# Patient Record
Sex: Female | Born: 1970 | Race: White | Hispanic: No | Marital: Married | Smoking: Never smoker
Health system: Southern US, Community
[De-identification: ages and names within clinical notes are randomized; demographics above are authoritative.]

## PROBLEM LIST (undated history)

## (undated) DIAGNOSIS — K219 Gastro-esophageal reflux disease without esophagitis: Secondary | ICD-10-CM

## (undated) DIAGNOSIS — T7840XA Allergy, unspecified, initial encounter: Secondary | ICD-10-CM

## (undated) DIAGNOSIS — K648 Other hemorrhoids: Secondary | ICD-10-CM

## (undated) DIAGNOSIS — Z5189 Encounter for other specified aftercare: Secondary | ICD-10-CM

## (undated) HISTORY — PX: HEMORRHOID SURGERY: SHX153

## (undated) HISTORY — DX: Allergy, unspecified, initial encounter: T78.40XA

## (undated) HISTORY — DX: Encounter for other specified aftercare: Z51.89

## (undated) HISTORY — DX: Other hemorrhoids: K64.8

## (undated) HISTORY — DX: Gastro-esophageal reflux disease without esophagitis: K21.9

---

## 1999-04-23 ENCOUNTER — Emergency Department (HOSPITAL_COMMUNITY): Admission: EM | Admit: 1999-04-23 | Discharge: 1999-04-23 | Payer: Self-pay | Admitting: Emergency Medicine

## 1999-04-23 ENCOUNTER — Encounter: Payer: Self-pay | Admitting: Emergency Medicine

## 1999-09-21 ENCOUNTER — Encounter: Admission: RE | Admit: 1999-09-21 | Discharge: 1999-12-20 | Payer: Self-pay | Admitting: Family Medicine

## 2000-02-02 ENCOUNTER — Encounter: Admission: RE | Admit: 2000-02-02 | Discharge: 2000-02-02 | Payer: Self-pay | Admitting: Hematology and Oncology

## 2003-01-08 ENCOUNTER — Encounter: Payer: Self-pay | Admitting: Family Medicine

## 2003-01-08 ENCOUNTER — Ambulatory Visit (HOSPITAL_COMMUNITY): Admission: RE | Admit: 2003-01-08 | Discharge: 2003-01-08 | Payer: Self-pay | Admitting: Family Medicine

## 2003-01-21 ENCOUNTER — Encounter: Admission: RE | Admit: 2003-01-21 | Discharge: 2003-01-21 | Payer: Self-pay | Admitting: Family Medicine

## 2003-01-21 ENCOUNTER — Encounter: Payer: Self-pay | Admitting: Family Medicine

## 2003-04-05 ENCOUNTER — Emergency Department (HOSPITAL_COMMUNITY): Admission: AD | Admit: 2003-04-05 | Discharge: 2003-04-05 | Payer: Self-pay | Admitting: Family Medicine

## 2004-03-21 ENCOUNTER — Emergency Department (HOSPITAL_COMMUNITY): Admission: EM | Admit: 2004-03-21 | Discharge: 2004-03-21 | Payer: Self-pay | Admitting: Emergency Medicine

## 2005-07-26 ENCOUNTER — Emergency Department (HOSPITAL_COMMUNITY): Admission: EM | Admit: 2005-07-26 | Discharge: 2005-07-26 | Payer: Self-pay | Admitting: Family Medicine

## 2006-04-13 ENCOUNTER — Emergency Department (HOSPITAL_COMMUNITY): Admission: EM | Admit: 2006-04-13 | Discharge: 2006-04-13 | Payer: Self-pay | Admitting: Family Medicine

## 2007-07-28 ENCOUNTER — Emergency Department (HOSPITAL_COMMUNITY): Admission: EM | Admit: 2007-07-28 | Discharge: 2007-07-28 | Payer: Self-pay | Admitting: Family Medicine

## 2008-01-15 ENCOUNTER — Ambulatory Visit: Payer: Self-pay | Admitting: Occupational Medicine

## 2008-01-15 DIAGNOSIS — J069 Acute upper respiratory infection, unspecified: Secondary | ICD-10-CM | POA: Insufficient documentation

## 2008-01-15 DIAGNOSIS — R519 Headache, unspecified: Secondary | ICD-10-CM | POA: Insufficient documentation

## 2008-01-15 DIAGNOSIS — R51 Headache: Secondary | ICD-10-CM | POA: Insufficient documentation

## 2008-01-18 ENCOUNTER — Ambulatory Visit: Payer: Self-pay | Admitting: Occupational Medicine

## 2008-10-10 ENCOUNTER — Ambulatory Visit: Payer: Self-pay | Admitting: Internal Medicine

## 2008-10-10 DIAGNOSIS — K219 Gastro-esophageal reflux disease without esophagitis: Secondary | ICD-10-CM | POA: Insufficient documentation

## 2008-10-11 ENCOUNTER — Encounter (INDEPENDENT_AMBULATORY_CARE_PROVIDER_SITE_OTHER): Payer: Self-pay | Admitting: *Deleted

## 2008-10-11 LAB — CONVERTED CEMR LAB
ALT: 28 units/L (ref 0–35)
AST: 24 units/L (ref 0–37)
Albumin: 3.8 g/dL (ref 3.5–5.2)
Alkaline Phosphatase: 46 units/L (ref 39–117)
BUN: 8 mg/dL (ref 6–23)
Basophils Absolute: 0 10*3/uL (ref 0.0–0.1)
Basophils Relative: 0 % (ref 0.0–3.0)
Bilirubin Urine: NEGATIVE
Bilirubin, Direct: 0.1 mg/dL (ref 0.0–0.3)
CO2: 26 meq/L (ref 19–32)
Calcium: 8.9 mg/dL (ref 8.4–10.5)
Chloride: 109 meq/L (ref 96–112)
Cholesterol: 150 mg/dL (ref 0–200)
Creatinine, Ser: 0.8 mg/dL (ref 0.4–1.2)
Eosinophils Absolute: 0.1 10*3/uL (ref 0.0–0.7)
Eosinophils Relative: 1.9 % (ref 0.0–5.0)
GFR calc non Af Amer: 84.93 mL/min (ref >60)
Glucose, Bld: 90 mg/dL (ref 70–99)
HCT: 35.5 % — ABNORMAL LOW (ref 36.0–46.0)
HDL: 51.4 mg/dL (ref >39.00)
Hemoglobin, Urine: NEGATIVE
Hemoglobin: 11.8 g/dL — ABNORMAL LOW (ref 12.0–15.0)
Ketones, ur: NEGATIVE mg/dL
LDL Cholesterol: 86 mg/dL (ref 0–99)
Leukocytes, UA: NEGATIVE
Lymphocytes Relative: 36.8 % (ref 12.0–46.0)
Lymphs Abs: 1.7 10*3/uL (ref 0.7–4.0)
MCHC: 33.3 g/dL (ref 30.0–36.0)
MCV: 86 fL (ref 78.0–100.0)
Monocytes Absolute: 0.5 10*3/uL (ref 0.1–1.0)
Monocytes Relative: 11.1 % (ref 3.0–12.0)
Neutro Abs: 2.2 10*3/uL (ref 1.4–7.7)
Neutrophils Relative %: 50.2 % (ref 43.0–77.0)
Nitrite: NEGATIVE
Platelets: 395 10*3/uL (ref 150.0–400.0)
Potassium: 3.9 meq/L (ref 3.5–5.1)
RBC: 4.13 M/uL (ref 3.87–5.11)
RDW: 14.6 % (ref 11.5–14.6)
Sodium: 139 meq/L (ref 135–145)
Specific Gravity, Urine: <=1.005 (ref 1.000–1.030)
TSH: 1.44 microintl units/mL (ref 0.35–5.50)
Total Bilirubin: 0.6 mg/dL (ref 0.3–1.2)
Total CHOL/HDL Ratio: 3
Total Protein, Urine: NEGATIVE mg/dL
Total Protein: 7.2 g/dL (ref 6.0–8.3)
Triglycerides: 64 mg/dL (ref 0.0–149.0)
Urine Glucose: NEGATIVE mg/dL
Urobilinogen, UA: 0.2 (ref 0.0–1.0)
VLDL: 12.8 mg/dL (ref 0.0–40.0)
WBC: 4.5 10*3/uL (ref 4.5–10.5)
pH: 6 (ref 5.0–8.0)

## 2008-10-14 ENCOUNTER — Encounter (INDEPENDENT_AMBULATORY_CARE_PROVIDER_SITE_OTHER): Payer: Self-pay | Admitting: *Deleted

## 2008-10-14 LAB — CONVERTED CEMR LAB: Vit D, 25-Hydroxy: 23 ng/mL — ABNORMAL LOW (ref 30–89)

## 2008-11-25 ENCOUNTER — Telehealth: Payer: Self-pay | Admitting: Internal Medicine

## 2009-06-30 ENCOUNTER — Ambulatory Visit: Payer: Self-pay | Admitting: Internal Medicine

## 2009-06-30 DIAGNOSIS — K645 Perianal venous thrombosis: Secondary | ICD-10-CM | POA: Insufficient documentation

## 2009-10-14 ENCOUNTER — Ambulatory Visit (HOSPITAL_COMMUNITY): Admission: RE | Admit: 2009-10-14 | Discharge: 2009-10-14 | Payer: Self-pay | Admitting: General Surgery

## 2009-10-15 ENCOUNTER — Emergency Department (HOSPITAL_BASED_OUTPATIENT_CLINIC_OR_DEPARTMENT_OTHER): Admission: EM | Admit: 2009-10-15 | Discharge: 2009-10-15 | Payer: Self-pay | Admitting: Emergency Medicine

## 2009-11-04 ENCOUNTER — Ambulatory Visit: Payer: Self-pay | Admitting: Family Medicine

## 2009-11-20 ENCOUNTER — Ambulatory Visit: Payer: Self-pay | Admitting: Sports Medicine

## 2009-11-20 DIAGNOSIS — M7750 Other enthesopathy of unspecified foot: Secondary | ICD-10-CM | POA: Insufficient documentation

## 2010-02-04 ENCOUNTER — Ambulatory Visit: Payer: Self-pay | Admitting: Emergency Medicine

## 2010-02-04 ENCOUNTER — Telehealth (INDEPENDENT_AMBULATORY_CARE_PROVIDER_SITE_OTHER): Payer: Self-pay | Admitting: *Deleted

## 2010-02-04 DIAGNOSIS — J3489 Other specified disorders of nose and nasal sinuses: Secondary | ICD-10-CM | POA: Insufficient documentation

## 2010-04-28 NOTE — Assessment & Plan Note (Signed)
Summary: R foot pain x 2 mths rm 4   Vital Signs:  Patient Profile:   40 Years Old Female CC:      R Foot pain x 2 mths Height:     68 inches (172.72 cm) Weight:      186.8 pounds O2 Sat:      100 % O2 treatment:    Room Air Temp:     98.3 degrees F oral Pulse rate:   75 / minute Pulse rhythm:   regular Resp:     16 per minute BP sitting:   123 / 82  (right arm) Cuff size:   regular  Vitals Entered By: Areta Haber CMA (November 04, 2009 4:52 PM)                  Current Allergies: ! PENICILLIN     History of Present Illness Chief Complaint: R Foot pain x 2 mths History of Present Illness: 40 yo F here with right plantar foot pain x 2 months.  Patient reports no known injury.  Pain started slowly and worsened over that time.  No swelling or bruising.  Better with rest.  Worse with ambulation especially in heels.  No problems with left foot.  Does not run for exercise.  No prior h/o stress fracture.  No prior h/o osteoporosis - vitamin D was low at a visit 1 year ago but was not repleted.  Current Problems: FOOT PAIN, RIGHT (ICD-729.5) UNSPECIFIED THROMBOSED HEMORRHOIDS (ICD-455.7) SPECIAL SCREENING FOR OSTEOPOROSIS (ICD-V82.81) PHYSICAL EXAMINATION (ICD-V70.0) GERD (ICD-530.81) JAUNDICE, HX OF (ICD-V15.9) EATING DISORDER, HX OF (ICD-V15.89) CHICKENPOX, HX OF (ICD-V15.9) URI (ICD-465.9) HEADACHE (ICD-784.0) DEPRESSION (ICD-311)   Current Meds ATIVAN 0.5 MG TABS (LORAZEPAM) prn GIANVI 3-0.02 MG TABS (DROSPIRENONE-ETHINYL ESTRADIOL) once daily ANALPRAM-HC SINGLES 1-2.5 % CREA (HYDROCORTISONE ACE-PRAMOXINE) apply three times a day as needed  REVIEW OF SYSTEMS Constitutional Symptoms      Denies fever, chills, night sweats, weight loss, weight gain, and fatigue.  Eyes       Denies change in vision, eye pain, eye discharge, glasses, contact lenses, and eye surgery. Ear/Nose/Throat/Mouth       Denies hearing loss/aids, change in hearing, ear pain, ear discharge,  dizziness, frequent runny nose, frequent nose bleeds, sinus problems, sore throat, hoarseness, and tooth pain or bleeding.  Respiratory       Denies dry cough, productive cough, wheezing, shortness of breath, asthma, bronchitis, and emphysema/COPD.  Cardiovascular       Denies murmurs, chest pain, and tires easily with exhertion.    Gastrointestinal       Denies stomach pain, nausea/vomiting, diarrhea, constipation, blood in bowel movements, and indigestion. Genitourniary       Denies painful urination, kidney stones, and loss of urinary control. Neurological       Denies paralysis, seizures, and fainting/blackouts. Musculoskeletal       Denies muscle pain, joint pain, joint stiffness, decreased range of motion, redness, swelling, muscle weakness, and gout.  Skin       Denies bruising, unusual mles/lumps or sores, and hair/skin or nail changes.  Psych       Denies mood changes, temper/anger issues, anxiety/stress, speech problems, depression, and sleep problems. Other Comments: R foot pain x 2 mths.    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  Mild pes planus.  TTP 2nd MT head > 3rd MT head plantar aspect of foot.  Minimal callus formation.  No hallux rigidus or valgus. Assessment New Problems: FOOT PAIN, RIGHT (ICD-729.5)  2/2 metatarsalgia and less likely mortons neuroma  (none palpated) or stress fracture (no running but vit d low previously)  Plan New Orders: New Patient Level II [99202] Planning Comments:   Will make appointment at [facility] for ultrasound to assess for stress fracture and metatarsal pads if none seen.  Told to bring favorite 2-3 pairs of heels normally wears for work.  Will establish care with Dr. Linford Arnold or Bowen and have Vit D rechecked before repletion since has been over a year.  Meanwhile take OTC supplements >1000 International Units/day.  Follow Up: Follow up on an as needed basis  The patient and/or caregiver has been counseled thoroughly with regard to medications prescribed including dosage, schedule, interactions, rationale for use, and possible side effects and they verbalize understanding.  Diagnoses and expected course of recovery discussed and will return if not improved as expected or if the condition worsens. Patient and/or caregiver verbalized understanding.   Orders Added: 1)  New Patient Level II [99202]

## 2010-04-28 NOTE — Assessment & Plan Note (Signed)
Summary: HEMORRHOIDS/NWS   Vital Signs:  Patient profile:   40 year old female Height:      68 inches (172.72 cm) Weight:      185.4 pounds (84.27 kg) O2 Sat:      98 % on Room air Temp:     98.8 degrees F (37.11 degrees C) oral Pulse rate:   81 / minute BP sitting:   120 / 72  (left arm) Cuff size:   regular  Vitals Entered By: Orlan Leavens (June 30, 2009 1:52 PM)  O2 Flow:  Room air CC: Hemorrhoids Is Patient Diabetic? No   Primary Care Provider:  Newt Lukes MD  CC:  Hemorrhoids.  History of Present Illness: here with c/o hemorrhoid flare - hx same but not this bad in years - tx self with sitz baths -and cortisone or prep H - currently, pain is 9/10 and unable to sit due to pain - (sit on pillow) no fever, min bleeding except with BM - no fever or drainage or leaking -   Current Medications (verified): 1)  Ativan 0.5 Mg Tabs (Lorazepam) .... Prn 2)  Diclofenac Potassium 50 Mg Tabs (Diclofenac Potassium) .Marland Kitchen.. 1 By Mouth Two Times A Day As Needed Musculokeletal Pain 3)  Doryx 150 Mg Tbec (Doxycycline Hyclate) .... Take 1 By Mouth Qd 4)  Gianvi 3-0.02 Mg Tabs (Drospirenone-Ethinyl Estradiol) .... Once Daily  Allergies (verified): 1)  ! Penicillin  Past History:  Past Medical History: Depression Headache GERD  Review of Systems  The patient denies fever, weight loss, chest pain, headaches, and abdominal pain.    Physical Exam  General:  alert, well-developed, well-nourished, and cooperative to examination.    Lungs:  normal respiratory effort, no intercostal retractions or use of accessory muscles; normal breath sounds bilaterally - no crackles and no wheezes.    Heart:  normal rate, regular rhythm, no murmur, and no rub. BLE without edema. normal DP pulses and normal cap refill in all 4 extremities    Rectal:  thrombosed and prolapsed hemorrhoid at 6 oclock as well as other smaller affected areas circumfrentially, ?condoloma - no fissure and no  appreciable abcess or infection on exam   Impression & Recommendations:  Problem # 1:  UNSPECIFIED THROMBOSED HEMORRHOIDS (ICD-455.7)  uncertain if internal or external with exam so will refer to gen surg for definitve tx of same - cont topical tx until then -  at pt request, will give medrol shot for antiinflammatory as has good relief with same on cruise ship previously -  Orders: Prescription Created Electronically [phone]) Surgical Referral (Surgery) Depo- Medrol 80mg  (J1040) Admin of Therapeutic Inj  intramuscular or subcutaneous (56433)  Complete Medication List: 1)  Ativan 0.5 Mg Tabs (Lorazepam) .... Prn 2)  Diclofenac Potassium 50 Mg Tabs (Diclofenac potassium) .Marland Kitchen.. 1 by mouth two times a day as needed musculokeletal pain 3)  Doryx 150 Mg Tbec (Doxycycline hyclate) .... Take 1 by mouth qd 4)  Gianvi 3-0.02 Mg Tabs (Drospirenone-ethinyl estradiol) .... Once daily 5)  Analpram-hc Singles 1-2.5 % Crea (Hydrocortisone ace-pramoxine) .... Apply three times a day as needed  Patient Instructions: 1)  it was good to see you today.  2)  shot of medrol (steroids) given for inflammation today - 3)  Analpram HC 2.5% as discussed - your prescription has been electronically submitted to your pharmacy. Please take as directed. Contact our office if you believe you're having problems with the medication(s).  4)  we'll make referral to GI  or general surgery for definitve treatment of the problem, not just the symptoms. Our office will contact you regarding this appointment once made.  5)  Please schedule a follow-up appointment as needed. Prescriptions: ANALPRAM-HC SINGLES 1-2.5 % CREA (HYDROCORTISONE ACE-PRAMOXINE) apply three times a day as needed  #30 x 1   Entered and Authorized by:   Newt Lukes MD   Signed by:   Newt Lukes MD on 06/30/2009   Method used:   Electronically to        [facility]* (retail)       [address]       [address]  Shipping/mailing       [place], [place]  [address]       Ph: [phone]       Fax: [phone]   RxID:   [id number]    Medication Administration  Injection # 1:    Medication: Depo- Medrol 80mg     Diagnosis: UNSPECIFIED THROMBOSED HEMORRHOIDS (ICD-455.7)    Route: IM    Site: LUOQ gluteus    Exp Date: 01/2012    Lot #: [id number]    Mfr: Pharmacia    Patient tolerated injection without complications    Given by: Orlan Leavens (June 30, 2009 3:09 PM)  Orders Added: 1)  Est. Patient Level IV [84132] 2)  Prescription Created Electronically [id number] 3)  Surgical Referral [Surgery] 4)  Depo- Medrol 80mg  [J1040] 5)  Admin of Therapeutic Inj  intramuscular or subcutaneous [27253]

## 2010-04-28 NOTE — Progress Notes (Signed)
  Phone Note Outgoing Call Call back at [facility]   Action Taken: Phone Call Completed Summary of Call: Rx for Z-pak called in to [facility] outpatient pharm for [place] Simmonds. Spoke with Morrie Sheldon

## 2010-04-28 NOTE — Assessment & Plan Note (Signed)
Summary: SWELLING AROUND NOSE/WB   Vital Signs:  Patient Profile:   40 Years Old Female CC:      Swelling around nose x 4 days Height:     68 inches (172.72 cm) Weight:      185 pounds O2 Sat:      98 % O2 treatment:    Room Air Temp:     98.6 degrees F oral Pulse rate:   83 / minute Pulse rhythm:   regular Resp:     16 per minute BP sitting:   114 / 77  (left arm) Cuff size:   regular  Vitals Entered By: Emilio Math (February 04, 2010 8:02 AM)                  Current Allergies (reviewed today): ! PENICILLINHistory of Present Illness History from: patient Chief Complaint: Swelling around nose x 4 days History of Present Illness: Swelling around her nose for 4 days.  Doesn't recall any trauma.  Feels tender, sore with sinus pressure.  Using Sudafed for allergies and using Ibuprofen which helps this.  No F/C/N/V, mild URI symptoms.    Current Meds ATIVAN 0.5 MG TABS (LORAZEPAM) prn GIANVI 3-0.02 MG TABS (DROSPIRENONE-ETHINYL ESTRADIOL) once daily PREVACID 15 MG CPDR (LANSOPRAZOLE)  DORYX 150 MG TBEC (DOXYCYCLINE HYCLATE)  ZITHROMAX Z-PAK 250 MG TABS (AZITHROMYCIN) use as directed  REVIEW OF SYSTEMS Constitutional Symptoms      Denies fever, chills, night sweats, weight loss, weight gain, and fatigue.  Eyes       Denies change in vision, eye pain, eye discharge, glasses, contact lenses, and eye surgery. Ear/Nose/Throat/Mouth       Denies hearing loss/aids, change in hearing, ear pain, ear discharge, dizziness, frequent runny nose, frequent nose bleeds, sinus problems, sore throat, hoarseness, and tooth pain or bleeding.  Respiratory       Denies dry cough, productive cough, wheezing, shortness of breath, asthma, bronchitis, and emphysema/COPD.  Cardiovascular       Denies murmurs, chest pain, and tires easily with exhertion.    Gastrointestinal       Denies stomach pain, nausea/vomiting, diarrhea, constipation, blood in bowel movements, and  indigestion. Genitourniary       Denies painful urination, kidney stones, and loss of urinary control. Neurological       Denies paralysis, seizures, and fainting/blackouts. Musculoskeletal       Complains of swelling.      Denies muscle pain, joint pain, joint stiffness, decreased range of motion, redness, muscle weakness, and gout.  Skin       Denies bruising, unusual mles/lumps or sores, and hair/skin or nail changes.  Psych       Denies mood changes, temper/anger issues, anxiety/stress, speech problems, depression, and sleep problems.  Past History:  Past Medical History: Reviewed history from 06/30/2009 and no changes required. Depression Headache GERD  Past Surgical History: Reviewed history from 10/10/2008 and no changes required. Tonsillectomy (1973)  Family History: Reviewed history from 10/10/2008 and no changes required. Heart disease (parent & grandparent) Emotional/Mental Illness (grandparent) pancreatic cancer - dad died 31 y  Social History: Reviewed history from 10/10/2008 and no changes required. 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  TTP on nasal bridge, perhaps mild swelling, no bruising Oral/Pharynx: mild clear PND Neck: neck supple,  trachea  midline, no masses Chest/Lungs: no rales, wheezes, or rhonchi bilateral, breath sounds equal without effort Heart: regular rate and  rhythm, no murmur Skin: no obvious rashes or lesions MSE: oriented to time, place, and person Assessment New Problems: OTHER DISEASES OF NASAL CAVITY AND SINUSES (ICD-478.19)  Possible unknown trauma and nasal contusion.  Otherwise perhaps sinus infection.  Patient Education: Patient and/or caregiver instructed in the following: rest, fluids,  Ibuprofen prn.  Plan New Medications/Changes: ZITHROMAX Z-PAK 250 MG TABS (AZITHROMYCIN) use as directed  #1 x 0, 02/04/2010, Hoyt Koch MD  New Orders: Est. Patient Level III [phone] Planning Comments:   Ice bridge of nose and continue your normal allergy meds for the next few days.  If not improving, then should take the Zpak to treat a sinus infection.  If worsening or new symptoms, consider PCP or ENT follow up.  If not improving in 3-5 days despite antibiotics, can call in "Flonase 2 sprays each nostril daily for 1 week, Disp #1" to see if that can reduce the inflammation.   The patient and/or caregiver has been counseled thoroughly with regard to medications prescribed including dosage, schedule, interactions, rationale for use, and possible side effects and they verbalize understanding.  Diagnoses and expected course of recovery discussed and will return if not improved as expected or if the condition worsens. Patient and/or caregiver verbalized understanding.  Prescriptions: ZITHROMAX Z-PAK 250 MG TABS (AZITHROMYCIN) use as directed  #1 x 0   Entered and Authorized by:   Hoyt Koch MD   Signed by:   Hoyt Koch MD on 02/04/2010   Method used:   Print then Give to Patient   RxID:   [id number]   Orders Added: 1)  Est. Patient Level III [95621]

## 2010-04-28 NOTE — Assessment & Plan Note (Signed)
Summary: NP R FOOT PAIN X 3 MOS   Vital Signs:  Patient profile:   40 year old female Height:      68 inches Weight:      180 pounds BMI:     27.47 BP sitting:   102 / 71  Vitals Entered By: Enid Baas MD (November 20, 2009 10:03 AM)  Primary Provider:  Newt Lukes MD   History of Present Illness: Tamara Lozano is a 40 year old female who presents today with a 3 month history of right foot pain. She localizes the pain to the plantar surface of the foot, at the metatarsal heads. 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 She does not experience significant numbness or tingling in the foot or toes. She also experiences the pain in her athletic shoes, but it is not as bad. She denies any weakness or loss of sensation.  Allergies: 1)  ! 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 Some callous on the medial aspect of the first MTP, bilaterally, as well. Transverse arch collapse bilaterally. Pronation while standing and walking, bilaterally (L>R). Mild tenderness to palpation along heads of the right second and third metatarsal heads. No tenderness at insertion of plantar fascia on calcaneous.  5/5 foot dorsiflexion, plantarflexion, eversion and inversion with full range of motion.  Neurologic:  Sensation intact to light touch on the feet and toes bilaterally.   Impression & Recommendations:  Problem # 1:  FOOT PAIN, RIGHT (ICD-729.5)  Tamara Lozano presents with a three month history of right foot pain localized to the forefoot. Her symptoms and clinical signs of mortons foot, callous, transverse arch collapse and pronation make pain from stress at the second and third metatarsal heads the  most likely etiology. We will try metatarsal pads in her heels and sports insoles with metatarsal pads in her running shoes to take pressure off the metatarsal heads.  Orders: Sports Insoles [phone])  Problem # 2:  METATARSALGIA (ICD-726.70)  The patients pain is consistent with metatarasalgia. In a patient with Morton's foot and significant transverse arch collapse, we will try to support that arch using metatarasal pads in her heels: small in the left shoe and medium in the right shoe. We will also add sports insoles with metatarsal pads in her athletic shoes to support the transverse and longitudinal arches. The patient tried these adjustments and did not experience any pain. She is to return in 2-3 weeks if they seem to help and we will put pads in her other shoes.  Orders: Sports Insoles [phone])  Complete Medication List: 1)  Ativan 0.5 Mg Tabs (Lorazepam) .... Prn 2)  Gianvi 3-0.02 Mg Tabs (Drospirenone-ethinyl estradiol) .... Once daily 3)  Analpram-hc Singles 1-2.5 % Crea (Hydrocortisone ace-pramoxine) .... Apply three times a day as needed

## 2010-06-13 LAB — URINALYSIS, ROUTINE W REFLEX MICROSCOPIC
Bilirubin Urine: NEGATIVE
Glucose, UA: NEGATIVE mg/dL
Hgb urine dipstick: NEGATIVE
Ketones, ur: NEGATIVE mg/dL
Nitrite: NEGATIVE
Protein, ur: NEGATIVE mg/dL
Specific Gravity, Urine: 1.019 (ref 1.005–1.030)
Urobilinogen, UA: 0.2 mg/dL (ref 0.0–1.0)
pH: 6 (ref 5.0–8.0)

## 2010-06-13 LAB — URINE CULTURE: Colony Count: NO GROWTH

## 2010-06-14 LAB — BASIC METABOLIC PANEL
CO2: 26 mEq/L (ref 19–32)
Calcium: 9.5 mg/dL (ref 8.4–10.5)
Creatinine, Ser: 0.9 mg/dL (ref 0.4–1.2)
GFR calc Af Amer: >60 mL/min (ref >60)
Glucose, Bld: 87 mg/dL (ref 70–99)

## 2010-06-14 LAB — DIFFERENTIAL
Basophils Relative: 1 % (ref 0–1)
Eosinophils Absolute: 0.1 10*3/uL (ref 0.0–0.7)
Neutrophils Relative %: 47 % (ref 43–77)

## 2010-06-14 LAB — CBC
MCH: 30.3 pg (ref 26.0–34.0)
MCHC: 33.7 g/dL (ref 30.0–36.0)
Platelets: 330 10*3/uL (ref 150–400)

## 2010-06-14 LAB — SURGICAL PCR SCREEN
MRSA, PCR: NEGATIVE
Staphylococcus aureus: NEGATIVE

## 2010-10-29 ENCOUNTER — Encounter: Payer: Self-pay | Admitting: Emergency Medicine

## 2010-10-29 ENCOUNTER — Inpatient Hospital Stay (INDEPENDENT_AMBULATORY_CARE_PROVIDER_SITE_OTHER)
Admission: RE | Admit: 2010-10-29 | Discharge: 2010-10-29 | Disposition: A | Discharge status: Home / Self Care | Payer: Self-pay | Source: Ambulatory Visit | Attending: Emergency Medicine | Admitting: Emergency Medicine

## 2010-10-29 DIAGNOSIS — J029 Acute pharyngitis, unspecified: Secondary | ICD-10-CM | POA: Insufficient documentation

## 2010-10-29 LAB — CONVERTED CEMR LAB: Rapid Strep: NEGATIVE

## 2011-03-01 NOTE — Progress Notes (Signed)
Summary: sore throat Room 5   Vital Signs:  Patient Profile:   40 Years Old Female CC:      Sore throat x 1 day Height:     68 inches (172.72 cm) Weight:      188 pounds O2 Sat:      99 % O2 treatment:    Room Air Temp:     98.6 degrees F oral Pulse rate:   82 / minute Pulse rhythm:   regular Resp:     16 per minute BP sitting:   113 / 71  (left arm) Cuff size:   regular  Vitals Entered By: Emilio Math (October 29, 2010 5:40 PM)                  Current Allergies (reviewed today): ! PENICILLINHistory of Present Illness History from: patient Chief Complaint: Sore throat x 1 day History of Present Illness: Pt complains of  1 day of worsening of sore throat, post-nasal drainage. Mimimal nasal congestion.  + sore throat. No cough, No dyspnea. No chest pain. No wheezing.  No nausea No vomiting. Low grade fever yesterday, No chills.  + headache No focal neuro sxs. Exposed to husband with sinusitis. She denies hx of recurrent sinusitis or recurrent strep.  She denies chronic allergic rhinits sxs.  Current Meds GIANVI 3-0.02 MG TABS (DROSPIRENONE-ETHINYL ESTRADIOL) once daily ZITHROMAX Z-PAK 250 MG TABS (AZITHROMYCIN) as directed  REVIEW OF SYSTEMS Constitutional Symptoms      Denies fever, chills, night sweats, weight loss, weight gain, and fatigue.  Eyes       Denies change in vision, eye pain, eye discharge, glasses, contact lenses, and eye surgery. Ear/Nose/Throat/Mouth       Complains of sore throat.      Denies hearing loss/aids, change in hearing, ear pain, ear discharge, dizziness, frequent runny nose, frequent nose bleeds, sinus problems, hoarseness, and tooth pain or bleeding.  Respiratory       Denies dry cough, productive cough, wheezing, shortness of breath, asthma, bronchitis, and emphysema/COPD.  Cardiovascular       Denies murmurs, chest pain, and tires easily with exhertion.    Gastrointestinal       Denies stomach pain, nausea/vomiting,  diarrhea, constipation, blood in bowel movements, and indigestion. Genitourniary       Denies painful urination, kidney stones, and loss of urinary control. Neurological       Complains of headaches.      Denies paralysis, seizures, and fainting/blackouts. Musculoskeletal       Denies muscle pain, joint pain, joint stiffness, decreased range of motion, redness, swelling, muscle weakness, and gout.  Skin       Denies bruising, unusual mles/lumps or sores, and hair/skin or nail changes.  Psych       Denies mood changes, temper/anger issues, anxiety/stress, speech problems, depression, and sleep problems.  Past History:  Past Medical History: Reviewed history from 06/30/2009 and no changes required. Depression Headache GERD  Past Surgical History: Reviewed history from 10/10/2008 and no changes required. Tonsillectomy (1973)  Family History: Reviewed history from 10/10/2008 and no changes required. Heart disease (parent & grandparent) Emotional/Mental Illness (grandparent) pancreatic cancer - dad died 51 y  Social History: Reviewed history from 10/10/2008 and no changes required. 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 Mild post-cervical mm ttp.  Eyes: conjunctivae and lids normal Ears: normal, no lesions or  deformities. TM's normal. Nasal: mildly swollen red turbinates with minimal  congestion Oral/Pharynx: pharyngeal erythema without exudate, uvula midline without deviation Neck: supple,anterior lymphadenopathy present Thyroid: no nodules, masses, tenderness, or enlargement Chest/Lungs: no rales, wheezes, or rhonchi bilateral, breath sounds equal without effort Heart: regular rate and  rhythm, no murmur Skin: no obvious rashes or lesions MSE: oriented to time, place, and person Assessment New Problems: PHARYNGITIS,  ACUTE (ICD-462)  RST: negative.  Patient Education: Patient and/or caregiver instructed in the following: Ibuprofen prn, rest fluids and Tylenol.  Plan New Medications/Changes: ZITHROMAX Z-PAK 250 MG TABS (AZITHROMYCIN) as directed  #1 Pak x 0, 10/29/2010, Lajean Manes MD  New Orders: Rapid Strep [[id number]] Est. Patient Level III [19147] Planning Comments:   Mucinex-D, 1 q am.  and Mucinex, 1 q pm.  She declined sending off strep cx. URI tx instruction sheet given. Rx given for Z-pack, to fill if no better in 3 days. Fill rx sooner  if sxs worsen sooner. Risks, benefits, alternatives discussed. Pt voiced understanding and agreement.  Follow Up: Follow up on an as needed basis, Follow up with Primary Physician  The patient and/or caregiver has been counseled thoroughly with regard to medications prescribed including dosage, schedule, interactions, rationale for use, and possible side effects and they verbalize understanding.  Diagnoses and expected course of recovery discussed and will return if not improved as expected or if the condition worsens. Patient and/or caregiver verbalized understanding.  Prescriptions: ZITHROMAX Z-PAK 250 MG TABS (AZITHROMYCIN) as directed  #1 Pak x 0   Entered and Authorized by:   Lajean Manes MD   Signed by:   Lajean Manes MD on 10/29/2010   Method used:   Handwritten   RxID:   [id number]   Orders Added: 1)  Rapid Strep [[id number]] 2)  Est. Patient Level III [28413]    Laboratory Results  Date/Time Received: October 29, 2010 6:09 PM  Date/Time Reported: October 29, 2010 6:09 PM   Other Tests  Rapid Strep: negative

## 2012-10-23 ENCOUNTER — Ambulatory Visit (INDEPENDENT_AMBULATORY_CARE_PROVIDER_SITE_OTHER): Payer: 59 | Admitting: Sports Medicine

## 2012-10-23 ENCOUNTER — Encounter: Payer: Self-pay | Admitting: Sports Medicine

## 2012-10-23 ENCOUNTER — Ambulatory Visit (HOSPITAL_BASED_OUTPATIENT_CLINIC_OR_DEPARTMENT_OTHER)
Admission: RE | Admit: 2012-10-23 | Discharge: 2012-10-23 | Disposition: A | Discharge status: Home / Self Care | Payer: 59 | Source: Ambulatory Visit | Attending: Sports Medicine | Admitting: Sports Medicine

## 2012-10-23 VITALS — BP 104/64 | HR 75 | Wt 170.0 lb

## 2012-10-23 DIAGNOSIS — M25579 Pain in unspecified ankle and joints of unspecified foot: Secondary | ICD-10-CM | POA: Insufficient documentation

## 2012-10-23 DIAGNOSIS — S838X9A Sprain of other specified parts of unspecified knee, initial encounter: Secondary | ICD-10-CM

## 2012-10-23 DIAGNOSIS — S86811A Strain of other muscle(s) and tendon(s) at lower leg level, right leg, initial encounter: Secondary | ICD-10-CM

## 2012-10-23 DIAGNOSIS — S86819A Strain of other muscle(s) and tendon(s) at lower leg level, unspecified leg, initial encounter: Secondary | ICD-10-CM | POA: Insufficient documentation

## 2012-10-23 MED ORDER — IBUPROFEN 800 MG PO TABS: 800.0000 mg | ORAL_TABLET | Freq: Three times a day (TID) | ORAL | Status: DC | PRN

## 2012-10-23 NOTE — Assessment & Plan Note (Signed)
I think this represents a grade 2 strain. There is a defect in the fibular shaft that is painful, we do need x-rays for this reason. Ibuprofen 800. Ankle was wrapped with compressive dressing. Home rehabilitation Return an as-needed basis.

## 2012-10-23 NOTE — Progress Notes (Signed)
  Subjective:    CC: Ankle pain.  HPI:  Unfortunately, a couple of weeks ago Tamara Lozano was running, took a misstep, and felt a sharp pain on the anterior aspect of her distal lower leg. She did not invert or evert the ankle, she had some swelling, mild bruising which has since resolved. Unfortunately she's continued to have pain and she localizes in the anterior aspect of her lower leg. Worse with dorsiflexion of the foot. Pain is moderate, persistent. She has not used any oral medications for this, has not used any bracing, and has not done any exercises.  Past medical history, Surgical history, Family history not pertinant except as noted below, Social history, Allergies, and medications have been entered into the medical record, reviewed, and no changes needed.   Review of Systems: No headache, visual changes, nausea, vomiting, diarrhea, constipation, dizziness, abdominal pain, skin rash, fevers, chills, night sweats, swollen lymph nodes, weight loss, chest pain, body aches, joint swelling, muscle aches, shortness of breath, mood changes, visual or auditory hallucinations.  Objective:    General: Well Developed, well nourished, and in no acute distress.  Neuro: Alert and oriented x3, extra-ocular muscles intact, sensation grossly intact.  HEENT: Normocephalic, atraumatic, pupils equal round reactive to light, neck supple, no masses, no lymphadenopathy, thyroid nonpalpable.  Skin: Warm and dry, no rashes noted.  Cardiac: Regular rate and rhythm, no murmurs rubs or gallops.  Respiratory: Clear to auscultation bilaterally. Not using accessory muscles, speaking in full sentences.  Abdominal: Soft, nontender, nondistended, positive bowel sounds, no masses, no organomegaly.  Right Ankle: No visible erythema or swelling. Tender to palpation over the distal tibialis anterior. Pain with resisted ankle dorsiflexion. Range of motion is full in all directions. Strength is 5/5 in all directions. Stable  lateral and medial ligaments; squeeze test and kleiger test unremarkable; Talar dome nontender; No pain at base of 5th MT; No tenderness over cuboid; No tenderness over N spot or navicular prominence No tenderness on posterior aspects of lateral and medial malleolus No sign of peroneal tendon subluxations or tenderness to palpation Negative tarsal tunnel tinel's Able to walk 4 steps.  X-rays reviewed show no sign of fracture, dislocation, or degenerative change.  Ankle was strapped with compressive dressing.  Impression and Recommendations:    The patient was counselled, risk factors were discussed, anticipatory guidance given.

## 2013-02-01 ENCOUNTER — Other Ambulatory Visit: Payer: Self-pay

## 2014-03-14 ENCOUNTER — Other Ambulatory Visit (HOSPITAL_COMMUNITY): Payer: Self-pay | Admitting: Obstetrics & Gynecology

## 2014-03-14 DIAGNOSIS — Z1231 Encounter for screening mammogram for malignant neoplasm of breast: Secondary | ICD-10-CM

## 2014-03-21 ENCOUNTER — Ambulatory Visit (HOSPITAL_COMMUNITY)
Admission: RE | Admit: 2014-03-21 | Discharge: 2014-03-21 | Disposition: A | Discharge status: Home / Self Care | Payer: 59 | Source: Ambulatory Visit | Attending: Obstetrics & Gynecology | Admitting: Obstetrics & Gynecology

## 2014-03-21 DIAGNOSIS — Z1231 Encounter for screening mammogram for malignant neoplasm of breast: Secondary | ICD-10-CM | POA: Insufficient documentation

## 2014-03-26 ENCOUNTER — Other Ambulatory Visit: Payer: Self-pay | Admitting: Obstetrics & Gynecology

## 2014-03-26 DIAGNOSIS — R928 Other abnormal and inconclusive findings on diagnostic imaging of breast: Secondary | ICD-10-CM

## 2014-05-14 ENCOUNTER — Other Ambulatory Visit: Payer: 59

## 2014-05-28 ENCOUNTER — Ambulatory Visit
Admission: RE | Admit: 2014-05-28 | Discharge: 2014-05-28 | Disposition: A | Discharge status: Home / Self Care | Payer: 59 | Source: Ambulatory Visit | Attending: Obstetrics & Gynecology | Admitting: Obstetrics & Gynecology

## 2014-05-28 DIAGNOSIS — R928 Other abnormal and inconclusive findings on diagnostic imaging of breast: Secondary | ICD-10-CM

## 2015-09-09 DIAGNOSIS — Z01419 Encounter for gynecological examination (general) (routine) without abnormal findings: Secondary | ICD-10-CM | POA: Diagnosis not present

## 2015-09-09 DIAGNOSIS — Z1231 Encounter for screening mammogram for malignant neoplasm of breast: Secondary | ICD-10-CM | POA: Diagnosis not present

## 2015-09-09 DIAGNOSIS — Z6827 Body mass index (BMI) 27.0-27.9, adult: Secondary | ICD-10-CM | POA: Diagnosis not present

## 2015-09-10 DIAGNOSIS — Z130 Encounter for screening for diseases of the blood and blood-forming organs and certain disorders involving the immune mechanism: Secondary | ICD-10-CM | POA: Diagnosis not present

## 2015-09-10 DIAGNOSIS — Z0000 Encounter for general adult medical examination without abnormal findings: Secondary | ICD-10-CM | POA: Diagnosis not present

## 2015-09-10 DIAGNOSIS — Z1389 Encounter for screening for other disorder: Secondary | ICD-10-CM | POA: Diagnosis not present

## 2015-09-10 DIAGNOSIS — Z1329 Encounter for screening for other suspected endocrine disorder: Secondary | ICD-10-CM | POA: Diagnosis not present

## 2015-09-10 DIAGNOSIS — Z13220 Encounter for screening for lipoid disorders: Secondary | ICD-10-CM | POA: Diagnosis not present

## 2015-10-08 DIAGNOSIS — R278 Other lack of coordination: Secondary | ICD-10-CM | POA: Diagnosis not present

## 2015-10-08 DIAGNOSIS — M62838 Other muscle spasm: Secondary | ICD-10-CM | POA: Diagnosis not present

## 2015-10-08 DIAGNOSIS — N3946 Mixed incontinence: Secondary | ICD-10-CM | POA: Diagnosis not present

## 2015-10-08 DIAGNOSIS — R102 Pelvic and perineal pain: Secondary | ICD-10-CM | POA: Diagnosis not present

## 2015-10-08 DIAGNOSIS — M6281 Muscle weakness (generalized): Secondary | ICD-10-CM | POA: Diagnosis not present

## 2015-11-04 DIAGNOSIS — R278 Other lack of coordination: Secondary | ICD-10-CM | POA: Diagnosis not present

## 2015-11-04 DIAGNOSIS — R102 Pelvic and perineal pain: Secondary | ICD-10-CM | POA: Diagnosis not present

## 2015-11-04 DIAGNOSIS — M62838 Other muscle spasm: Secondary | ICD-10-CM | POA: Diagnosis not present

## 2015-11-04 DIAGNOSIS — N3946 Mixed incontinence: Secondary | ICD-10-CM | POA: Diagnosis not present

## 2015-11-04 DIAGNOSIS — M6281 Muscle weakness (generalized): Secondary | ICD-10-CM | POA: Diagnosis not present

## 2015-12-08 DIAGNOSIS — R278 Other lack of coordination: Secondary | ICD-10-CM | POA: Diagnosis not present

## 2015-12-08 DIAGNOSIS — M62838 Other muscle spasm: Secondary | ICD-10-CM | POA: Diagnosis not present

## 2015-12-08 DIAGNOSIS — M6281 Muscle weakness (generalized): Secondary | ICD-10-CM | POA: Diagnosis not present

## 2015-12-08 DIAGNOSIS — N3946 Mixed incontinence: Secondary | ICD-10-CM | POA: Diagnosis not present

## 2015-12-29 DIAGNOSIS — M6281 Muscle weakness (generalized): Secondary | ICD-10-CM | POA: Diagnosis not present

## 2015-12-29 DIAGNOSIS — M62838 Other muscle spasm: Secondary | ICD-10-CM | POA: Diagnosis not present

## 2015-12-29 DIAGNOSIS — N3946 Mixed incontinence: Secondary | ICD-10-CM | POA: Diagnosis not present

## 2015-12-29 DIAGNOSIS — R278 Other lack of coordination: Secondary | ICD-10-CM | POA: Diagnosis not present

## 2017-07-11 ENCOUNTER — Encounter (HOSPITAL_COMMUNITY): Payer: Self-pay | Admitting: Emergency Medicine

## 2017-07-11 ENCOUNTER — Ambulatory Visit (HOSPITAL_COMMUNITY)
Admission: EM | Admit: 2017-07-11 | Discharge: 2017-07-11 | Disposition: A | Discharge status: Home / Self Care | Payer: 59 | Attending: Urgent Care | Admitting: Urgent Care

## 2017-07-11 DIAGNOSIS — J3489 Other specified disorders of nose and nasal sinuses: Secondary | ICD-10-CM

## 2017-07-11 DIAGNOSIS — J069 Acute upper respiratory infection, unspecified: Secondary | ICD-10-CM

## 2017-07-11 DIAGNOSIS — B9789 Other viral agents as the cause of diseases classified elsewhere: Secondary | ICD-10-CM | POA: Diagnosis not present

## 2017-07-11 DIAGNOSIS — R05 Cough: Secondary | ICD-10-CM

## 2017-07-11 DIAGNOSIS — R0989 Other specified symptoms and signs involving the circulatory and respiratory systems: Secondary | ICD-10-CM

## 2017-07-11 DIAGNOSIS — R51 Headache: Secondary | ICD-10-CM

## 2017-07-11 DIAGNOSIS — R070 Pain in throat: Secondary | ICD-10-CM

## 2017-07-11 DIAGNOSIS — R059 Cough, unspecified: Secondary | ICD-10-CM

## 2017-07-11 DIAGNOSIS — R519 Headache, unspecified: Secondary | ICD-10-CM

## 2017-07-11 MED ORDER — PREDNISONE 20 MG PO TABS: ORAL_TABLET | ORAL | 0 refills | Status: DC

## 2017-07-11 MED ORDER — PSEUDOEPHEDRINE HCL ER 120 MG PO TB12: 120.0000 mg | ORAL_TABLET | Freq: Two times a day (BID) | ORAL | 3 refills | Status: DC

## 2017-07-11 MED ORDER — BENZONATATE 100 MG PO CAPS: 100.0000 mg | ORAL_CAPSULE | Freq: Three times a day (TID) | ORAL | 0 refills | Status: DC | PRN

## 2017-07-11 MED ORDER — HYDROCODONE-HOMATROPINE 5-1.5 MG/5ML PO SYRP: 5.0000 mL | ORAL_SOLUTION | Freq: Every evening | ORAL | 0 refills | Status: DC | PRN

## 2017-07-11 MED ORDER — ALBUTEROL SULFATE HFA 108 (90 BASE) MCG/ACT IN AERS: 1.0000 | INHALATION_SPRAY | Freq: Four times a day (QID) | RESPIRATORY_TRACT | 0 refills | Status: DC | PRN

## 2017-07-11 MED FILL — BENZONATATE 100 MG CAPS: 100 | 10 days supply | Qty: 60 | Fill #0

## 2017-07-11 MED FILL — HYDROCODONE-HOMATROPINE SYR: 5-1.5 | 24 days supply | Qty: 120 | Fill #0

## 2017-07-11 MED FILL — VENTOLIN HFA 90 MCG INHALER: 108 (90 BAS | 25 days supply | Qty: 18 | Fill #0

## 2017-07-11 MED FILL — predniSONE 20 MG TABS: 20 | 5 days supply | Qty: 10 | Fill #0

## 2017-07-11 NOTE — Discharge Instructions (Signed)
Hydrate well with at least 2 liters (1 gallon) of water daily. For sore throat try using a honey-based tea. Use 3 teaspoons of honey with juice squeezed from half lemon. Place shaved pieces of ginger into 1/2-1 cup of water and warm over stove top. Then mix the ingredients and repeat every 4 hours as needed.  °

## 2017-07-11 NOTE — ED Triage Notes (Signed)
Pt here with URI sx x 5 days.  

## 2017-07-11 NOTE — ED Provider Notes (Signed)
  MRN: [id number] DOB: 07/03/1970  Subjective:   Tamara GaviaRebekah R Lozano is a 47 y.o. female presenting for 5-day history of productive cough, chest congestion, sinus headache, sinus pressure, itchy ears.  Has tried Mucinex with pseudoephedrine with minimal to no relief.  Denies fever, ear pain, chest pain, shortness of breath, wheezing, nausea, vomiting, abdominal pain.  Patient has a history of allergies but does not take any medications for this consistently.  Denies smoking cigarettes.  She does not take any medications for any chronic medical conditions.  Denies past medical history and past surgical history.  Allergies  Allergen Reactions  . Penicillins     REACTION: rash     Objective:   Vitals: BP 111/72 (BP Location: Left Arm)   Pulse 76   Temp 98.3 F (36.8 C) (Oral)   Resp 16   LMP  (LMP Unknown)   SpO2 98%   Physical Exam  Constitutional: She is oriented to person, place, and time. She appears well-developed and well-nourished.  HENT:  Right Ear: Tympanic membrane normal.  Left Ear: Tympanic membrane normal.  Nose: No mucosal edema or rhinorrhea.  Mouth/Throat: Oropharynx is clear and moist.  Eyes: Right eye exhibits no discharge. Left eye exhibits no discharge.  Neck: Normal range of motion. Neck supple.  Cardiovascular: Normal rate, regular rhythm and intact distal pulses. Exam reveals no gallop and no friction rub.  No murmur heard. Pulmonary/Chest: No respiratory distress. She has no wheezes. She has no rales.  Lymphadenopathy:    She has no cervical adenopathy.  Neurological: She is alert and oriented to person, place, and time.  Skin: Skin is warm and dry.  Psychiatric: She has a normal mood and affect.   Assessment and Plan :   Viral URI with cough  Sinus pressure  Cough  Chest congestion  Sinus headache  Throat pain  Physical exam findings reassuring.  Will manage as a viral URI.  Patient would like to be aggressive with treatment.  She will start  short steroid course, use Tessalon capsules and hydrocodone cough syrup.  Supportive care otherwise. Counseled patient on potential for adverse effects with medications prescribed today, patient verbalized understanding.    Wallis BambergMani, Priest Lockridge, [place]-C 07/11/17 1044

## 2017-07-12 ENCOUNTER — Telehealth (HOSPITAL_COMMUNITY): Payer: Self-pay

## 2017-07-12 NOTE — Telephone Encounter (Signed)
PT called requesting that we send her in an antibiotic, went over discharge instructions with patient about her being diagnosed with a viral infection not requiring antibiotics. Encouraged patient to be seen again if she feels something has changed and she now needs additional treatment. Pt verbalized understanding.

## 2017-07-14 ENCOUNTER — Ambulatory Visit (INDEPENDENT_AMBULATORY_CARE_PROVIDER_SITE_OTHER): Payer: Self-pay | Admitting: Family Medicine

## 2017-07-14 ENCOUNTER — Encounter: Payer: Self-pay | Admitting: Family Medicine

## 2017-07-14 ENCOUNTER — Telehealth: Payer: Self-pay | Admitting: Emergency Medicine

## 2017-07-14 VITALS — BP 110/70 | HR 79 | Temp 98.7°F | Wt 165.8 lb

## 2017-07-14 DIAGNOSIS — R059 Cough, unspecified: Secondary | ICD-10-CM

## 2017-07-14 DIAGNOSIS — R05 Cough: Secondary | ICD-10-CM

## 2017-07-14 DIAGNOSIS — J302 Other seasonal allergic rhinitis: Secondary | ICD-10-CM

## 2017-07-14 MED ORDER — MONTELUKAST SODIUM 10 MG PO TABS
10.0000 mg | ORAL_TABLET | Freq: Every day | ORAL | 0 refills | Status: DC
Start: 2017-07-14 — End: 2017-08-15

## 2017-07-14 MED ORDER — AEROCHAMBER PLUS FLO-VU LARGE MISC: 1.0000 | Freq: Once | 0 refills | Status: AC

## 2017-07-14 MED ORDER — FLUTICASONE PROPIONATE 50 MCG/ACT NA SUSP: 2.0000 | Freq: Every day | NASAL | 0 refills | Status: DC

## 2017-07-14 MED ORDER — PSEUDOEPH-BROMPHEN-DM 30-2-10 MG/5ML PO SYRP: 10.0000 mL | ORAL_SOLUTION | Freq: Four times a day (QID) | ORAL | 0 refills | Status: DC | PRN

## 2017-07-14 MED ORDER — CETIRIZINE HCL 10 MG PO TABS: 10.0000 mg | ORAL_TABLET | Freq: Every day | ORAL | 0 refills | Status: DC

## 2017-07-14 MED FILL — FLUTICASONE PROP 50 MCG SPR: 50 | 30 days supply | Qty: 16 | Fill #0

## 2017-07-14 MED FILL — BROMPHENIR-PSEUDOEPHED-DM S: 30-2-10 | 3 days supply | Qty: 120 | Fill #0

## 2017-07-14 MED FILL — MONTELUKAST SOD 10 MG TAB: 10 | 30 days supply | Qty: 30 | Fill #0

## 2017-07-14 NOTE — Telephone Encounter (Signed)
Called patient to let her know a spacer was sent in to her pharmacy,  No response left vm

## 2017-07-14 NOTE — Patient Instructions (Addendum)
PLAN< Discontinue:  Benzonatate, OTC Pseudoephedrine, and Hydrocodone cough syrup. START: Zyrtec, Singulair, and cough medicine 48 hour if no symptom improvement will consider antibiotics Tylenol at over the counter dose for pain, fever or general feeling of unwell  Allergic Rhinitis, Adult Allergic rhinitis is an allergic reaction that affects the mucous membrane inside the nose. It causes sneezing, a runny or stuffy nose, and the feeling of mucus going down the back of the throat (postnasal drip). Allergic rhinitis can be mild to severe. There are two types of allergic rhinitis:  Seasonal. This type is also called hay fever. It happens only during certain seasons.  Perennial. This type can happen at any time of the year.  What are the causes? This condition happens when the body's defense system (immune system) responds to certain harmless substances called allergens as though they were germs.  Seasonal allergic rhinitis is triggered by pollen, which can come from grasses, trees, and weeds. Perennial allergic rhinitis may be caused by:  House dust mites.  Pet dander.  Mold spores.  What are the signs or symptoms? Symptoms of this condition include:  Sneezing.  Runny or stuffy nose (nasal congestion).  Postnasal drip.  Itchy nose.  Tearing of the eyes.  Trouble sleeping.  Daytime sleepiness.  How is this diagnosed? This condition may be diagnosed based on:  Your medical history.  A physical exam.  Tests to check for related conditions, such as: ? Asthma. ? Pink eye. ? Ear infection. ? Upper respiratory infection.  Tests to find out which allergens trigger your symptoms. These may include skin or blood tests.  How is this treated? There is no cure for this condition, but treatment can help control symptoms. Treatment may include:  Taking medicines that block allergy symptoms, such as antihistamines. Medicine may be given as a shot, nasal spray, or  pill.  Avoiding the allergen.  Desensitization. This treatment involves getting ongoing shots until your body becomes less sensitive to the allergen. This treatment may be done if other treatments do not help.  If taking medicine and avoiding the allergen does not work, new, stronger medicines may be prescribed.  Follow these instructions at home:  Find out what you are allergic to. Common allergens include smoke, dust, and pollen.  Avoid the things you are allergic to. These are some things you can do to help avoid allergens: ? Replace carpet with wood, tile, or vinyl flooring. Carpet can trap dander and dust. ? Do not smoke. Do not allow smoking in your home. ? Change your heating and air conditioning filter at least once a month. ? During allergy season:  Keep windows closed as much as possible.  Plan outdoor activities when pollen counts are lowest. This is usually during the evening hours.  When coming indoors, change clothing and shower before sitting on furniture or bedding.  Take over-the-counter and prescription medicines only as told by your health care provider.  Keep all follow-up visits as told by your health care provider. This is important. Contact a health care provider if:  You have a fever.  You develop a persistent cough.  You make whistling sounds when you breathe (you wheeze).  Your symptoms interfere with your normal daily activities. Get help right away if:  You have shortness of breath. Summary  This condition can be managed by taking medicines as directed and avoiding allergens.  Contact your health care provider if you develop a persistent cough or fever.  During allergy season, keep windows  closed as much as possible. This information is not intended to replace advice given to you by your health care provider. Make sure you discuss any questions you have with your health care provider. Document Released: 12/08/2000 Document Revised: 04/22/2016  Document Reviewed: 04/22/2016 Elsevier Interactive Patient Education  2018 Elsevier Inc[employer] Cough, Adult A cough helps to clear your throat and lungs. A cough may last only 2-3 weeks (acute), or it may last longer than 8 weeks (chronic). Many different things can cause a cough. A cough may be a sign of an illness or another medical condition. Follow these instructions at home:  Pay attention to any changes in your cough.  Take medicines only as told by your doctor. ? If you were prescribed an antibiotic medicine, take it as told by your doctor. Do not stop taking it even if you start to feel better. ? Talk with your doctor before you try using a cough medicine.  Drink enough fluid to keep your pee (urine) clear or pale yellow.  If the air is dry, use a cold steam vaporizer or humidifier in your home.  Stay away from things that make you cough at work or at home.  If your cough is worse at night, try using extra pillows to raise your head up higher while you sleep.  Do not smoke, and try not to be around smoke. If you need help quitting, ask your doctor.  Do not have caffeine.  Do not drink alcohol.  Rest as needed. Contact a doctor if:  You have new problems (symptoms).  You cough up yellow fluid (pus).  Your cough does not get better after 2-3 weeks, or your cough gets worse.  Medicine does not help your cough and you are not sleeping well.  You have pain that gets worse or pain that is not helped with medicine.  You have a fever.  You are losing weight and you do not know why.  You have night sweats. Get help right away if:  You cough up blood.  You have trouble breathing.  Your heartbeat is very fast. This information is not intended to replace advice given to you by your health care provider. Make sure you discuss any questions you have with your health care provider. Document Released: 11/26/2010 Document Revised: 08/21/2015 Document Reviewed:  05/22/2014 Elsevier Interactive Patient Education  [employer]2018 [employer] Inc[employer]

## 2017-07-14 NOTE — Progress Notes (Signed)
Tamara Lozano 47 y.o. female who presents with persistent cough and cold symptoms- paient reports mild symptoms began about a week ago and she sought treatment in urgent care which provided medications (prednisone, albuterol, hydrocodone cough syrup,   Review of Systems  Constitutional: Negative for chills, fever and malaise/fatigue.  HENT: Positive for congestion.   Eyes: Negative.   Respiratory: Positive for cough.   Cardiovascular: Negative.   Gastrointestinal: Negative.   Genitourinary: Negative.   Musculoskeletal: Negative.   Skin: Negative.   Neurological: Negative.   Endo/Heme/Allergies: Negative.   Psychiatric/Behavioral: Negative.      O: Vitals:   07/14/17 0831  BP: 110/70  Pulse: 79  Temp: 98.7 F (37.1 C)  SpO2: 96%   Physical Exam  Constitutional: She is oriented to person, place, and time. Vital signs are normal. She appears well-developed and well-nourished.  HENT:  Right Ear: Hearing, external ear and ear canal normal. A middle ear effusion is present.  Left Ear: Hearing, external ear and ear canal normal. A middle ear effusion is present.  Nose: Rhinorrhea present.  Mouth/Throat: Uvula is midline and oropharynx is clear and moist. No oropharyngeal exudate, posterior oropharyngeal edema or posterior oropharyngeal erythema.  Eyes: Pupils are equal, round, and reactive to light.  Neck: Normal range of motion. Neck supple.  Cardiovascular: Normal rate and regular rhythm.  Pulmonary/Chest: Effort normal and breath sounds normal.  Persistent dry cough during exam reports of facial pressure but not pain on palpation  Abdominal: Soft. Bowel sounds are normal.  Musculoskeletal: Normal range of motion.  Neurological: She is alert and oriented to person, place, and time.  Skin: Skin is warm and dry.     O: 1. Cough   2. Seasonal allergic rhinitis, unspecified trigger    P: Discussed etiology and differential dx with patient, discussed medication use and  indications and f/u instructions. Patient verbalized understanding and agrees with POC at the time of visit. Discharge instructions below- work note declined Discontinue:  Benzonatate, OTC Pseudoephedrine, and Hydrocodone cough syrup. START: Zyrtec, Singulair, and cough medicine 48 hour if no symptom improvement will consider antibiotics Tylenol at over the counter dose for pain, fever or general feeling of unwell   1. Cough - montelukast (SINGULAIR) 10 MG tablet; Take 1 tablet (10 mg total) by mouth at bedtime. - brompheniramine-pseudoephedrine-DM 30-2-10 MG/5ML syrup; Take 10 mLs by mouth 4 (four) times daily as needed.  2. Seasonal allergic rhinitis, unspecified trigger - fluticasone (FLONASE) 50 MCG/ACT nasal spray; Place 2 sprays into both nostrils daily. - cetirizine (ZYRTEC) 10 MG tablet; Take 1 tablet (10 mg total) by mouth at bedtime.

## 2017-08-10 ENCOUNTER — Ambulatory Visit: Payer: Self-pay | Admitting: Family Medicine

## 2017-08-15 ENCOUNTER — Ambulatory Visit: Payer: 59 | Admitting: Family Medicine

## 2017-08-15 ENCOUNTER — Other Ambulatory Visit (HOSPITAL_COMMUNITY)
Admission: RE | Admit: 2017-08-15 | Discharge: 2017-08-15 | Disposition: A | Discharge status: Home / Self Care | Payer: 59 | Source: Ambulatory Visit | Attending: Family Medicine | Admitting: Family Medicine

## 2017-08-15 ENCOUNTER — Encounter: Payer: Self-pay | Admitting: Family Medicine

## 2017-08-15 VITALS — BP 120/78 | HR 79 | Temp 98.1°F | Ht 68.0 in | Wt 166.4 lb

## 2017-08-15 DIAGNOSIS — Z124 Encounter for screening for malignant neoplasm of cervix: Secondary | ICD-10-CM | POA: Diagnosis not present

## 2017-08-15 DIAGNOSIS — Z13220 Encounter for screening for lipoid disorders: Secondary | ICD-10-CM

## 2017-08-15 DIAGNOSIS — Z0000 Encounter for general adult medical examination without abnormal findings: Secondary | ICD-10-CM | POA: Diagnosis not present

## 2017-08-15 NOTE — Progress Notes (Signed)
Subjective:    Tamara Lozano is a 47 y.o. female and is here for a comprehensive physical exam.  Health Maintenance Due  Topic Date Due  . HIV Screening  11/30/1984  . PAP SMEAR  12/01/1990  . TETANUS/TDAP  03/29/2016   PMHx, SurgHx, SocialHx, Medications, and Allergies were reviewed in the Visit Navigator and updated as appropriate.   History reviewed. No pertinent past medical history. History reviewed. No pertinent surgical history. History reviewed. No pertinent family history. Social History   Tobacco Use  . Smoking status: Never Smoker  . Smokeless tobacco: Never Used  Substance Use Topics  . Alcohol use: Not on file  . Drug use: Not on file    Review of Systems:   Pertinent items are noted in the HPI. Otherwise, ROS is negative.  Objective:   BP 120/78   Pulse 79   Temp 98.1 F (36.7 C) (Oral)   Ht  (1.727 m)   Wt 166 lb 6.4 oz (75.5 kg)   LMP 08/09/2017   SpO2 96%   BMI 25.30 kg/m    Wt Readings from Last 3 Encounters:  08/15/17 166 lb 6.4 oz (75.5 kg)  07/14/17 165 lb 12.8 oz (75.2 kg)  10/23/12 170 lb (77.1 kg)     Ht Readings from Last 3 Encounters:  08/15/17  (1.727 m)   General appearance: alert, cooperative and appears stated age. Head: normocephalic, without obvious abnormality, atraumatic. Neck: no adenopathy, supple, symmetrical, trachea midline; thyroid not enlarged, symmetric, no tenderness/mass/nodules. Lungs: clear to auscultation bilaterally. Heart: regular rate and rhythm Abdomen: soft, non-tender; no masses,  no organomegaly. Extremities: extremities normal, atraumatic, no cyanosis or edema. Skin: skin color, texture, turgor normal, no rashes or lesions. Lymph: cervical, supraclavicular, and axillary nodes normal; no abnormal inguinal nodes palpated. Neurologic: grossly normal.  Pelvic:  External genitalia: no lesions.              Urethra: normal appearing urethra with no masses, tenderness or lesions.      Bartholins and Skenes: normal.               Vagina: normal appearing vagina with normal color and discharge, no lesions.              Cervix: normal appearance.              Pap and high risk HPV testing done: Yes.          Bimanual Exam:    Uterus: uterus is normal size, shape, consistency and nontender.                                      Adnexa: normal adnexa in size, nontender and no masses.                                       Assessment/Plan:   Tamara Lozano was seen today for establish care.  Diagnoses and all orders for this visit:  Routine physical examination -     CBC with Differential/Platelet -     Comprehensive metabolic panel  Pap smear for cervical cancer screening -     Cytology - PAP  Screening for lipid disorders -     Lipid panel   Patient Counseling:    Nutrition: Stressed importance of moderation in  sodium/caffeine intake, saturated fat and cholesterol, caloric balance, sufficient intake of fresh fruits, vegetables, fiber, calcium, iron, and 1 mg of folate supplement per day (for females capable of pregnancy).     Stressed the importance of regular exercise.      Substance Abuse: Discussed cessation/primary prevention of tobacco, alcohol, or other drug use; driving or other dangerous activities under the influence; availability of treatment for abuse.      Injury prevention: Discussed safety belts, safety helmets, smoke detector, smoking near bedding or upholstery.      Sexuality: Discussed sexually transmitted diseases, partner selection, use of condoms, avoidance of unintended pregnancy  and contraceptive alternatives.     Dental health: Discussed importance of regular tooth brushing, flossing, and dental visits.     Health maintenance and immunizations reviewed. Please refer to Health maintenance section.   Tamara Rima, DO [facility]

## 2017-08-16 LAB — CBC WITH DIFFERENTIAL/PLATELET
Basophils Absolute: 0 10*3/uL (ref 0.0–0.1)
Basophils Relative: 0.8 % (ref 0.0–3.0)
Eosinophils Absolute: 0.2 10*3/uL (ref 0.0–0.7)
Eosinophils Relative: 3.3 % (ref 0.0–5.0)
HCT: 41.7 % (ref 36.0–46.0)
Hemoglobin: 13.8 g/dL (ref 12.0–15.0)
Lymphocytes Relative: 36.1 % (ref 12.0–46.0)
Lymphs Abs: 1.8 10*3/uL (ref 0.7–4.0)
MCHC: 33.1 g/dL (ref 30.0–36.0)
MCV: 96.5 fl (ref 78.0–100.0)
Monocytes Absolute: 0.7 10*3/uL (ref 0.1–1.0)
Monocytes Relative: 14 % — ABNORMAL HIGH (ref 3.0–12.0)
Neutro Abs: 2.3 10*3/uL (ref 1.4–7.7)
Neutrophils Relative %: 45.8 % (ref 43.0–77.0)
Platelets: 337 10*3/uL (ref 150.0–400.0)
RBC: 4.32 Mil/uL (ref 3.87–5.11)
RDW: 13.7 % (ref 11.5–15.5)
WBC: 5.1 10*3/uL (ref 4.0–10.5)

## 2017-08-16 LAB — COMPREHENSIVE METABOLIC PANEL
ALT: 24 U/L (ref 0–35)
AST: 23 U/L (ref 0–37)
Albumin: 4 g/dL (ref 3.5–5.2)
Alkaline Phosphatase: 44 U/L (ref 39–117)
BUN: 21 mg/dL (ref 6–23)
CO2: 27 mEq/L (ref 19–32)
Calcium: 9 mg/dL (ref 8.4–10.5)
Chloride: 106 mEq/L (ref 96–112)
Creatinine, Ser: 0.81 mg/dL (ref 0.40–1.20)
GFR: 80.31 mL/min (ref >60.00)
Glucose, Bld: 96 mg/dL (ref 70–99)
Potassium: 3.9 mEq/L (ref 3.5–5.1)
Sodium: 140 mEq/L (ref 135–145)
Total Bilirubin: 0.2 mg/dL (ref 0.2–1.2)
Total Protein: 7.1 g/dL (ref 6.0–8.3)

## 2017-08-16 LAB — LIPID PANEL
Cholesterol: 145 mg/dL (ref 0–200)
HDL: 56.3 mg/dL (ref >39.00)
LDL Cholesterol: 72 mg/dL (ref 0–99)
NonHDL: 88.7
Total CHOL/HDL Ratio: 3
Triglycerides: 86 mg/dL (ref 0.0–149.0)
VLDL: 17.2 mg/dL (ref 0.0–40.0)

## 2017-08-17 LAB — CYTOLOGY - PAP
Diagnosis: NEGATIVE
HPV: NOT DETECTED

## 2017-09-13 ENCOUNTER — Ambulatory Visit: Payer: 59 | Admitting: Family Medicine

## 2018-08-21 NOTE — Progress Notes (Deleted)
   Subjective:    KINLEA GEHO is a 48 y.o. female and is here for a comprehensive physical exam.  Health Maintenance Due  Topic Date Due  . HIV Screening  11/30/1984  . TETANUS/TDAP  03/29/2016     Current Outpatient Medications:  .  cetirizine (ZYRTEC) 10 MG tablet, Take 1 tablet (10 mg total) by mouth at bedtime., Disp: 30 tablet, Rfl: 0  PMHx, SurgHx, SocialHx, Medications, and Allergies were reviewed in the Visit Navigator and updated as appropriate.   No past medical history on file.  No past surgical history on file.  No family history on file.  Social History   Tobacco Use  . Smoking status: Never Smoker  . Smokeless tobacco: Never Used  Substance Use Topics  . Alcohol use: Not on file  . Drug use: Not on file    Review of Systems:   Pertinent items are noted in the HPI. Otherwise, ROS is negative.  Objective:   There were no vitals taken for this visit.  General appearance: alert, cooperative and appears stated age. Head: normocephalic, without obvious abnormality, atraumatic. Neck: no adenopathy, supple, symmetrical, trachea midline; thyroid not enlarged, symmetric, no tenderness/mass/nodules. Lungs: clear to auscultation bilaterally. Breasts: inspection negative, no nipple retraction or dimpling, no nipple discharge or bleeding, no axillary or supraclavicular adenopathy, normal to palpation without dominant masses. Heart: regular rate and rhythm Abdomen: soft, non-tender; no masses,  no organomegaly. Extremities: extremities normal, atraumatic, no cyanosis or edema. Skin: skin color, texture, turgor normal, no rashes or lesions. Lymph: cervical, supraclavicular, and axillary nodes normal; no abnormal inguinal nodes palpated. Neurologic: grossly normal.  Pelvic:  External genitalia: no lesions. Urethra: normal appearing urethra with no masses, tenderness or lesions. Bartholin's and Skene's: normal. Vagina: normal appearing vagina with normal color  and discharge, no lesions. Cervix: normal appearance. Pap and high risk HPV testing done: {yes no:[id number]} Uterus: uterus is normal size, shape, consistency and nontender. Adnexa: normal adnexa in size, nontender and no masses.                                      Assessment/Plan:   There are no diagnoses linked to this encounter.  Patient Counseling: [x]    Nutrition: Stressed importance of moderation in sodium/caffeine intake, saturated fat and cholesterol, caloric balance, sufficient intake of fresh fruits, vegetables, fiber, calcium, iron, and 1 mg of folate supplement per day (for females capable of pregnancy).  [x]    Stressed the importance of regular exercise.   [x]    Substance Abuse: Discussed cessation/primary prevention of tobacco, alcohol, or other drug use; driving or other dangerous activities under the influence; availability of treatment for abuse.   [x]    Injury prevention: Discussed safety belts, safety helmets, smoke detector, smoking near bedding or upholstery.   [x]    Sexuality: Discussed sexually transmitted diseases, partner selection, use of condoms, avoidance of unintended pregnancy  and contraceptive alternatives.  [x]    Dental health: Discussed importance of regular tooth brushing, flossing, and dental visits.  [x]    Health maintenance and immunizations reviewed. Please refer to Health maintenance section.   Helane Rima, DO [place] [facility]

## 2018-08-22 ENCOUNTER — Encounter: Payer: 59 | Admitting: Family Medicine

## 2018-08-24 ENCOUNTER — Ambulatory Visit (INDEPENDENT_AMBULATORY_CARE_PROVIDER_SITE_OTHER): Payer: 59 | Admitting: Family Medicine

## 2018-08-24 ENCOUNTER — Encounter: Payer: Self-pay | Admitting: Family Medicine

## 2018-08-24 ENCOUNTER — Other Ambulatory Visit: Payer: Self-pay

## 2018-08-24 VITALS — BP 120/78 | HR 83 | Temp 98.6°F | Ht 68.0 in | Wt 174.4 lb

## 2018-08-24 DIAGNOSIS — R5383 Other fatigue: Secondary | ICD-10-CM

## 2018-08-24 DIAGNOSIS — Z23 Encounter for immunization: Secondary | ICD-10-CM | POA: Diagnosis not present

## 2018-08-24 DIAGNOSIS — Z13220 Encounter for screening for lipoid disorders: Secondary | ICD-10-CM | POA: Diagnosis not present

## 2018-08-24 DIAGNOSIS — Z0000 Encounter for general adult medical examination without abnormal findings: Secondary | ICD-10-CM

## 2018-08-24 DIAGNOSIS — Z114 Encounter for screening for human immunodeficiency virus [HIV]: Secondary | ICD-10-CM

## 2018-08-24 DIAGNOSIS — Z20828 Contact with and (suspected) exposure to other viral communicable diseases: Secondary | ICD-10-CM

## 2018-08-24 LAB — SAR COV2 SEROLOGY (COVID19)AB(IGG),IA: SARS CoV2 AB IGG: NEGATIVE

## 2018-08-24 NOTE — Progress Notes (Signed)
Subjective:    Tamara Lozano is a 48 y.o. female and is here for a comprehensive physical exam.  Health Maintenance Due  Topic Date Due  . HIV Screening  11/30/1984  . TETANUS/TDAP  03/29/2016     Current Outpatient Medications:  .  cetirizine (ZYRTEC) 10 MG tablet, Take 1 tablet (10 mg total) by mouth at bedtime., Disp: 30 tablet, Rfl: 0  PMHx, SurgHx, SocialHx, Medications, and Allergies were reviewed in the Visit Navigator and updated as appropriate.   History reviewed. No pertinent past medical history.  History reviewed. No pertinent surgical history.   Family History  Problem Relation Age of Onset  . Heart disease Mother     Social History   Tobacco Use  . Smoking status: Never Smoker  . Smokeless tobacco: Never Used  Substance Use Topics  . Alcohol use: Not on file  . Drug use: Not on file    Review of Systems:   Pertinent items are noted in the HPI. Otherwise, ROS is negative.  Objective:   BP 120/78   Pulse 83   Temp 98.6 F (37 C) (Oral)   Ht 5\' 8"  (1.727 m)   Wt 174 lb 6.4 oz (79.1 kg)   BMI 26.52 kg/m   General appearance: alert, cooperative and appears stated age. Head: normocephalic, without obvious abnormality, atraumatic. Neck: no adenopathy, supple, symmetrical, trachea midline; thyroid not enlarged, symmetric, no tenderness/mass/nodules. Lungs: clear to auscultation bilaterally. Heart: regular rate and rhythm Abdomen: soft, non-tender; no masses,  no organomegaly. Extremities: extremities normal, atraumatic, no cyanosis or edema. Skin: skin color, texture, turgor normal, no rashes or lesions. Lymph: cervical, supraclavicular, and axillary nodes normal; no abnormal inguinal nodes palpated. Neurologic: grossly normal.                                      Assessment/Plan:   Tamara Lozano was seen today for annual exam.  Diagnoses and all orders for this visit:  Routine physical examination  Exposure to SARS virus -     SAR CoV2  Serology (COVID 19)AB(IGG)IA  Need for Tdap vaccination -     Tdap vaccine greater than or equal to 7yo IM  Screening for lipid disorders -     Lipid panel  Screening for HIV (human immunodeficiency virus) -     HIV Antibody (routine testing w rflx)  Fatigue, unspecified type -     CBC with Differential/Platelet -     Comprehensive metabolic panel -     TSH    Patient Counseling: [x]    Nutrition: Stressed importance of moderation in sodium/caffeine intake, saturated fat and cholesterol, caloric balance, sufficient intake of fresh fruits, vegetables, fiber, calcium, iron, and 1 mg of folate supplement per day (for females capable of pregnancy).  [x]    Stressed the importance of regular exercise.   [x]    Substance Abuse: Discussed cessation/primary prevention of tobacco, alcohol, or other drug use; driving or other dangerous activities under the influence; availability of treatment for abuse.   [x]    Injury prevention: Discussed safety belts, safety helmets, smoke detector, smoking near bedding or upholstery.   [x]    Sexuality: Discussed sexually transmitted diseases, partner selection, use of condoms, avoidance of unintended pregnancy  and contraceptive alternatives.  [x]    Dental health: Discussed importance of regular tooth brushing, flossing, and dental visits.  [x]    Health maintenance and immunizations reviewed. Please refer to  Health maintenance section.   Tamara Deutscher, DO [place]

## 2018-08-25 ENCOUNTER — Encounter: Payer: Self-pay | Admitting: Family Medicine

## 2018-08-25 DIAGNOSIS — Z13220 Encounter for screening for lipoid disorders: Secondary | ICD-10-CM | POA: Diagnosis not present

## 2018-08-25 DIAGNOSIS — Z20828 Contact with and (suspected) exposure to other viral communicable diseases: Secondary | ICD-10-CM | POA: Diagnosis not present

## 2018-08-25 DIAGNOSIS — Z23 Encounter for immunization: Secondary | ICD-10-CM | POA: Diagnosis not present

## 2018-08-25 DIAGNOSIS — Z0000 Encounter for general adult medical examination without abnormal findings: Secondary | ICD-10-CM | POA: Diagnosis not present

## 2018-08-25 DIAGNOSIS — Z114 Encounter for screening for human immunodeficiency virus [HIV]: Secondary | ICD-10-CM | POA: Diagnosis not present

## 2018-08-25 DIAGNOSIS — R5383 Other fatigue: Secondary | ICD-10-CM | POA: Diagnosis not present

## 2018-08-25 LAB — CBC WITH DIFFERENTIAL/PLATELET
Basophils Absolute: 0.1 10*3/uL (ref 0.0–0.1)
Basophils Relative: 1 % (ref 0.0–3.0)
Eosinophils Absolute: 0.3 10*3/uL (ref 0.0–0.7)
Eosinophils Relative: 4.6 % (ref 0.0–5.0)
HCT: 40 % (ref 36.0–46.0)
Hemoglobin: 14.2 g/dL (ref 12.0–15.0)
Lymphocytes Relative: 35.7 % (ref 12.0–46.0)
Lymphs Abs: 2.1 10*3/uL (ref 0.7–4.0)
MCHC: 35.5 g/dL (ref 30.0–36.0)
MCV: 93.9 fl (ref 78.0–100.0)
Monocytes Absolute: 0.6 10*3/uL (ref 0.1–1.0)
Monocytes Relative: 10.7 % (ref 3.0–12.0)
Neutro Abs: 2.8 10*3/uL (ref 1.4–7.7)
Neutrophils Relative %: 48 % (ref 43.0–77.0)
Platelets: 297 10*3/uL (ref 150.0–400.0)
RBC: 4.27 Mil/uL (ref 3.87–5.11)
RDW: 14.9 % (ref 11.5–15.5)
WBC: 5.9 10*3/uL (ref 4.0–10.5)

## 2018-08-25 LAB — COMPREHENSIVE METABOLIC PANEL
ALT: 18 U/L (ref 0–35)
AST: 21 U/L (ref 0–37)
Albumin: 4 g/dL (ref 3.5–5.2)
Alkaline Phosphatase: 41 U/L (ref 39–117)
BUN: 18 mg/dL (ref 6–23)
CO2: 27 mEq/L (ref 19–32)
Calcium: 9.1 mg/dL (ref 8.4–10.5)
Chloride: 105 mEq/L (ref 96–112)
Creatinine, Ser: 0.8 mg/dL (ref 0.40–1.20)
GFR: 76.32 mL/min (ref >60.00)
Glucose, Bld: 86 mg/dL (ref 70–99)
Potassium: 4.1 mEq/L (ref 3.5–5.1)
Sodium: 139 mEq/L (ref 135–145)
Total Bilirubin: 0.3 mg/dL (ref 0.2–1.2)
Total Protein: 6.9 g/dL (ref 6.0–8.3)

## 2018-08-25 LAB — LIPID PANEL
Cholesterol: 168 mg/dL (ref 0–200)
HDL: 65.4 mg/dL (ref >39.00)
LDL Cholesterol: 90 mg/dL (ref 0–99)
NonHDL: 102.97
Total CHOL/HDL Ratio: 3
Triglycerides: 67 mg/dL (ref 0.0–149.0)
VLDL: 13.4 mg/dL (ref 0.0–40.0)

## 2018-08-25 LAB — TSH: TSH: 1.2 u[IU]/mL (ref 0.35–4.50)

## 2018-08-25 LAB — HIV ANTIBODY (ROUTINE TESTING W REFLEX): HIV 1&2 Ab, 4th Generation: NONREACTIVE

## 2018-12-13 ENCOUNTER — Telehealth: Payer: Self-pay

## 2018-12-13 NOTE — Telephone Encounter (Signed)
See note

## 2018-12-13 NOTE — Telephone Encounter (Signed)
Message from patient -  Hi Dr. Juleen China - I would like to restart a new weight loss program, however, every time I start, I take 2 steps forward and 5 steps back. I heard that Contrave might be something that would help me stay on track. What are your thoughts and would you be able to prescribe it for me?

## 2018-12-13 NOTE — Telephone Encounter (Signed)
Pt called back in, pt says that she contacted the insurance company and was told that the office has to reach out to them instead  [phone]

## 2018-12-13 NOTE — Telephone Encounter (Signed)
Per Dr. Juleen China, tell her to call insurance and see if it is covered.   Called pt and left VM to call the office.

## 2018-12-13 NOTE — Telephone Encounter (Signed)
Pt is calling to see if insurance covers and will then return call!

## 2018-12-13 NOTE — Telephone Encounter (Signed)
Will need to start a prior auth in order to see if covered. Please advise the dose and directions that you will be using for patient.

## 2018-12-16 MED ORDER — NALTREXONE-BUPROPION HCL ER 8-90 MG PO TB12: 1.0000 | ORAL_TABLET | Freq: Every day | ORAL | 1 refills | Status: DC

## 2018-12-16 NOTE — Addendum Note (Signed)
Addended by: Briscoe Deutscher R on: 12/16/2018 06:32 PM   Modules accepted: Orders

## 2018-12-16 NOTE — Telephone Encounter (Signed)
Rx sent to pharmacy   

## 2018-12-18 ENCOUNTER — Telehealth: Payer: Self-pay

## 2018-12-18 NOTE — Telephone Encounter (Signed)
Tamara Lozano Key: [id number] - PA Case ID: [id number] help? Call us at [phone] Status Sent to [place] 8-90MG  er tablets Form [employer] ePA Form

## 2018-12-21 NOTE — Telephone Encounter (Signed)
medications denied patient must have bmi over 30 or 27 with a weight related comorbidity. Have sent patient My chart to let her know.

## 2019-01-23 NOTE — Telephone Encounter (Signed)
See note

## 2019-01-23 NOTE — Telephone Encounter (Signed)
Pt is calling and the pharm told her contrave 70 tablets for 28 day  To start and then 120 tablets for 30 days and med should be approved per pharm

## 2019-08-09 ENCOUNTER — Ambulatory Visit (HOSPITAL_COMMUNITY)
Admission: EM | Admit: 2019-08-09 | Discharge: 2019-08-09 | Disposition: A | Discharge status: Home / Self Care | Payer: 59 | Attending: Family Medicine | Admitting: Family Medicine

## 2019-08-09 ENCOUNTER — Encounter (HOSPITAL_COMMUNITY): Payer: Self-pay

## 2019-08-09 ENCOUNTER — Other Ambulatory Visit: Payer: Self-pay

## 2019-08-09 DIAGNOSIS — R112 Nausea with vomiting, unspecified: Secondary | ICD-10-CM | POA: Diagnosis not present

## 2019-08-09 DIAGNOSIS — R519 Headache, unspecified: Secondary | ICD-10-CM | POA: Insufficient documentation

## 2019-08-09 DIAGNOSIS — Z79899 Other long term (current) drug therapy: Secondary | ICD-10-CM | POA: Insufficient documentation

## 2019-08-09 DIAGNOSIS — Z20822 Contact with and (suspected) exposure to covid-19: Secondary | ICD-10-CM | POA: Insufficient documentation

## 2019-08-09 DIAGNOSIS — R1110 Vomiting, unspecified: Secondary | ICD-10-CM | POA: Diagnosis present

## 2019-08-09 DIAGNOSIS — Z8249 Family history of ischemic heart disease and other diseases of the circulatory system: Secondary | ICD-10-CM | POA: Insufficient documentation

## 2019-08-09 DIAGNOSIS — Z880 Allergy status to penicillin: Secondary | ICD-10-CM | POA: Diagnosis not present

## 2019-08-09 MED ORDER — ONDANSETRON HCL 4 MG PO TABS
4.0000 mg | ORAL_TABLET | Freq: Four times a day (QID) | ORAL | 0 refills | Status: DC
Start: 2019-08-09 — End: 2019-08-09

## 2019-08-09 MED ORDER — KETOROLAC TROMETHAMINE 30 MG/ML IJ SOLN
30.0000 mg | Freq: Once | INTRAMUSCULAR | Status: AC
  Administered 2019-08-09: 30 mg via INTRAMUSCULAR

## 2019-08-09 MED ORDER — ONDANSETRON 4 MG PO TBDP
4.0000 mg | ORAL_TABLET | Freq: Once | ORAL | Status: AC
  Administered 2019-08-09: 4 mg via ORAL

## 2019-08-09 MED ORDER — ACETAMINOPHEN 500 MG PO TABS
1000.0000 mg | ORAL_TABLET | Freq: Four times a day (QID) | ORAL | 0 refills | Status: AC | PRN
Start: 2019-08-09 — End: ?

## 2019-08-09 MED ORDER — ACETAMINOPHEN 500 MG PO TABS
1000.0000 mg | ORAL_TABLET | Freq: Four times a day (QID) | ORAL | 0 refills | Status: DC | PRN
Start: 2019-08-09 — End: 2019-08-09

## 2019-08-09 MED ORDER — ONDANSETRON 4 MG PO TBDP
ORAL_TABLET | ORAL | Status: AC
  Filled 2019-08-09: qty 1

## 2019-08-09 MED ORDER — KETOROLAC TROMETHAMINE 30 MG/ML IJ SOLN
INTRAMUSCULAR | Status: AC
  Filled 2019-08-09: qty 1

## 2019-08-09 MED ORDER — ONDANSETRON HCL 4 MG PO TABS
4.0000 mg | ORAL_TABLET | Freq: Four times a day (QID) | ORAL | 0 refills | Status: DC
Start: 2019-08-09 — End: 2020-07-15

## 2019-08-09 NOTE — Discharge Instructions (Addendum)
Take 2 extra strength tylenol for you headache  Use the zofran up to every 8 hours for vomiting, if you continue to vomit and can not tolerate liquids through the evening, have severe abdominal pain or high fever, you should be evaluated in the Emergency Department  If your Covid-19 test is positive, you will receive a phone call from [facility] regarding your results. Negative test results are not called. Both positive and negative results area always visible on MyChart. If you do not have a MyChart account, sign up instructions are in your discharge papers.   Persons who are directed to care for themselves at home may discontinue isolation under the following conditions:   At least 10 days have passed since symptom onset and  At least 24 hours have passed without running a fever (this means without the use of fever-reducing medications) and  Other symptoms have improved.  Persons infected with COVID-19 who never develop symptoms may discontinue isolation and other precautions 10 days after the date of their first positive COVID-19 test.

## 2019-08-09 NOTE — ED Triage Notes (Signed)
Pt c/o N/V, HA that started today. Pt denies diarrhea. Pt states vomited 20-30 times today.

## 2019-08-09 NOTE — ED Provider Notes (Signed)
[facility]    CSN: [id number] Arrival date & time: 08/09/19  1800      History   Chief Complaint Chief Complaint  Patient presents with  . Emesis    HPI Tamara Lozano is a 49 y.o. female.   Patient reports urgent care for vomiting and headache.  She reports she woke this morning around 3 AM with a headache.  She reports the headache is frontal and up to an 8/10 at times.  She reports a headache has been persistent throughout the day and has been as low as a 4/10.  She reports a light at times made the headache little worse.  She reports she may feel a little lightheaded earlier in the day however is not experiencing that currently.  Denies any numbness, tingling or weakness.  She does endorse having history of headaches and migraines.  She comments that this is not the worst headache she has ever had however it is lasting a little longer than when she has had before.  She has not been to tolerate taking any medicines for the headache as she has been vomiting throughout the day.  She reports she has believed she has vomited about 20-30 times.  She reports she has been unable to tolerate any liquids or foods today.  There is been no blood in the vomit.  She reports she continues to be nauseous in exam room but has not vomited since arriving.  She denies fever but has had some chills.  Denies any cough, sore throat, congestion.  Denies any abdominal pain.  Denies diarrhea.  She reports she had a bowel movement today and it was normal for her without diarrhea or constipation.  Denies any chest pains or shortness of breath.  Denies anyone else is sick at home.  She reports she received both Covid vaccines a few months ago.       History reviewed. No pertinent past medical history.  There are no problems to display for this patient.   Past Surgical History:  Procedure Laterality Date  . HEMORRHOID SURGERY      OB History   No obstetric history on file.      Home  Medications    Prior to Admission medications   Medication Sig Start Date End Date Taking? Authorizing Provider  acetaminophen (TYLENOL) 500 MG tablet Take 2 tablets (1,000 mg total) by mouth every 6 (six) hours as needed. 08/09/19   Dontae Minerva, Veryl Speak, PA-C  cetirizine (ZYRTEC) 10 MG tablet Take 1 tablet (10 mg total) by mouth at bedtime. 07/14/17   Zachery Dauer, NP  Naltrexone-buPROPion HCl ER 8-90 MG TB12 Take 1 tablet by mouth daily. 12/16/18   Helane Rima, DO  ondansetron (ZOFRAN) 4 MG tablet Take 1 tablet (4 mg total) by mouth every 6 (six) hours. 08/09/19   Waldron Gerry, Veryl Speak, PA-C    Family History Family History  Problem Relation Age of Onset  . Heart disease Mother     Social History Social History   Tobacco Use  . Smoking status: Never Smoker  . Smokeless tobacco: Never Used  Substance Use Topics  . Alcohol use: Never  . Drug use: Never     Allergies   Penicillins   Review of Systems Review of Systems Per HPI  Physical Exam Triage Vital Signs ED Triage Vitals  Enc Vitals Group     BP 08/09/19 1811 115/66     Pulse Rate 08/09/19 1811 90     Resp  08/09/19 1811 16     Temp 08/09/19 1811 (!) 97 F (36.1 C)     Temp Source 08/09/19 1811 Oral     SpO2 08/09/19 1811 100 %     Weight 08/09/19 1812 148 lb (67.1 kg)     Height 08/09/19 1812 5\' 8"  (1.727 m)     Head Circumference --      Peak Flow --      Pain Score 08/09/19 1812 4     Pain Loc --      Pain Edu? --      Excl. in GC? --    No data found.  Updated Vital Signs BP 115/66   Pulse 90   Temp (!) 97 F (36.1 C) (Oral)   Resp 16   Ht 5\' 8"  (1.727 m)   Wt 148 lb (67.1 kg)   SpO2 100%   BMI 22.50 kg/m   Visual Acuity Right Eye Distance:   Left Eye Distance:   Bilateral Distance:    Right Eye Near:   Left Eye Near:    Bilateral Near:     Physical Exam Vitals and nursing note reviewed.  Constitutional:      General: She is not in acute distress.    Appearance: She is well-developed. She is  not ill-appearing.  HENT:     Head: Normocephalic and atraumatic.     Nose: Nose normal. No congestion or rhinorrhea.     Mouth/Throat:     Mouth: Mucous membranes are moist.     Pharynx: Oropharynx is clear. No posterior oropharyngeal erythema.  Eyes:     Extraocular Movements: Extraocular movements intact.     Conjunctiva/sclera: Conjunctivae normal.     Pupils: Pupils are equal, round, and reactive to light.  Cardiovascular:     Rate and Rhythm: Normal rate and regular rhythm.     Heart sounds: No murmur.  Pulmonary:     Effort: Pulmonary effort is normal. No respiratory distress.     Breath sounds: Normal breath sounds. No wheezing, rhonchi or rales.  Abdominal:     Palpations: Abdomen is soft.     Tenderness: There is no abdominal tenderness.  Musculoskeletal:     Cervical back: Normal range of motion and neck supple. No rigidity or tenderness.  Lymphadenopathy:     Cervical: No cervical adenopathy.  Skin:    General: Skin is warm and dry.  Neurological:     General: No focal deficit present.     Mental Status: She is alert and oriented to person, place, and time.     Cranial Nerves: No cranial nerve deficit.     Sensory: No sensory deficit.     Motor: No weakness.      UC Treatments / Results  Labs (all labs ordered are listed, but only abnormal results are displayed) Labs Reviewed  SARS CORONAVIRUS 2 (TAT 6-24 HRS)    EKG   Radiology No results found.  Procedures Procedures (including critical care time)  Medications Ordered in UC Medications  ondansetron (ZOFRAN-ODT) disintegrating tablet 4 mg (4 mg Oral Given 08/09/19 1854)  ketorolac (TORADOL) 30 MG/ML injection 30 mg (30 mg Intramuscular Given 08/09/19 1854)    Initial Impression / Assessment and Plan / UC Course  I have reviewed the triage vital signs and the nursing notes.  Pertinent labs & imaging results that were available during my care of the patient were reviewed by me and considered in my  medical decision making (see chart for  details).     #Nausea and vomiting #Headache Patient is a 49 year old presenting with headache with nausea and vomiting.  Unclear of nausea or vomiting is related to headache or as a part of a viral illness.  Given no abdominal tenderness doubt intra-abdominal etiology.  She has stable vital signs and is afebrile.  Do not believe she is clinically dehydrated.  Offered Toradol and Zofran in clinic, patient accepts.  Discussed use of Zofran at home and increasing p.o. liquids if possible.  Discussed use of sugar-free Gatorade or Pedialyte.  Discussed that if she is unable to tolerate liquids throughout the evening despite treatment that she should report to the emergency department for further evaluation and work-up to prevent dehydration.  Strict emergency department precautions for severe abdominal pain or severely worsening headache were also given.  Patient verbalized understanding his plan. Final Clinical Impressions(s) / UC Diagnoses   Final diagnoses:  Nausea and vomiting, intractability of vomiting not specified, unspecified vomiting type  Nonintractable headache, unspecified chronicity pattern, unspecified headache type     Discharge Instructions     Take 2 extra strength tylenol for you headache  Use the zofran up to every 8 hours for vomiting, if you continue to vomit and can not tolerate liquids through the evening, have severe abdominal pain or high fever, you should be evaluated in the Emergency Department  If your Covid-19 test is positive, you will receive a phone call from [facility] regarding your results. Negative test results are not called. Both positive and negative results area always visible on MyChart. If you do not have a MyChart account, sign up instructions are in your discharge papers.   Persons who are directed to care for themselves at home may discontinue isolation under the following conditions:  . At least 10 days  have passed since symptom onset and . At least 24 hours have passed without running a fever (this means without the use of fever-reducing medications) and . Other symptoms have improved.  Persons infected with COVID-19 who never develop symptoms may discontinue isolation and other precautions 10 days after the date of their first positive COVID-19 test.     ED Prescriptions    Medication Sig Dispense Auth. Provider   ondansetron (ZOFRAN) 4 MG tablet  (Status: Discontinued) Take 1 tablet (4 mg total) by mouth every 6 (six) hours. 6 tablet Kebin Maye, Marguerita Beards, PA-C   acetaminophen (TYLENOL) 500 MG tablet  (Status: Discontinued) Take 2 tablets (1,000 mg total) by mouth every 6 (six) hours as needed. 30 tablet Dawud Mays, Marguerita Beards, PA-C   ondansetron (ZOFRAN) 4 MG tablet Take 1 tablet (4 mg total) by mouth every 6 (six) hours. 6 tablet Chelsee Hosie, Marguerita Beards, PA-C   acetaminophen (TYLENOL) 500 MG tablet Take 2 tablets (1,000 mg total) by mouth every 6 (six) hours as needed. 30 tablet Nema Oatley, Marguerita Beards, PA-C     PDMP not reviewed this encounter.   Purnell Shoemaker, PA-C 08/09/19 2053

## 2019-08-10 LAB — SARS CORONAVIRUS 2 (TAT 6-24 HRS): SARS Coronavirus 2: NEGATIVE

## 2020-03-04 ENCOUNTER — Telehealth: Payer: 59 | Admitting: Emergency Medicine

## 2020-03-04 DIAGNOSIS — R112 Nausea with vomiting, unspecified: Secondary | ICD-10-CM | POA: Diagnosis not present

## 2020-03-04 MED ORDER — ONDANSETRON 4 MG PO TBDP
4.0000 mg | ORAL_TABLET | Freq: Three times a day (TID) | ORAL | 0 refills | Status: DC | PRN
Start: 2020-03-04 — End: 2020-03-06

## 2020-03-04 NOTE — Progress Notes (Signed)
We are sorry that you are not feeling well. Here is how we plan to help!  Based on what you have shared with me it looks like you have a Virus that is irritating your GI tract.  Vomiting is the forceful emptying of a portion of the stomach's content through the mouth.  Although nausea and vomiting can make you feel miserable, it's important to remember that these are not diseases, but rather symptoms of an underlying illness.  When we treat short term symptoms, we always caution that any symptoms that persist should be fully evaluated in a medical office.  I have prescribed a medication that will help alleviate your symptoms and allow you to stay hydrated:  Zofran 4 mg 1 tablet every 8 hours as needed for nausea and vomiting   You can also use nasal saline sprays for your sinus congestion.  Take Tylenol or motrin for fever.  HOME CARE:  Drink clear liquids.  This is very important! Dehydration (the lack of fluid) can lead to a serious complication.  Start off with 1 tablespoon every 5 minutes for 8 hours.  You may begin eating bland foods after 8 hours without vomiting.  Start with saltine crackers, white bread, rice, mashed potatoes, applesauce.  After 48 hours on a bland diet, you may resume a normal diet.  Try to go to sleep.  Sleep often empties the stomach and relieves the need to vomit.  GET HELP RIGHT AWAY IF:   Your symptoms do not improve or worsen within 2 days after treatment.  You have a fever for over 3 days.  You cannot keep down fluids after trying the medication.  MAKE SURE YOU:   Understand these instructions.  Will watch your condition.  Will get help right away if you are not doing well or get worse.   Thank you for choosing an e-visit. Your e-visit answers were reviewed by a board certified advanced clinical practitioner to complete your personal care plan. Depending upon the condition, your plan could have included both over the counter or prescription  medications. Please review your pharmacy choice. Be sure that the pharmacy you have chosen is open so that you can pick up your prescription now.  If there is a problem you may message your provider in MyChart to have the prescription routed to another pharmacy. Your safety is important to [place]. If you have drug allergies check your prescription carefully.  For the next 24 hours, you can use MyChart to ask questions about today's visit, request a non-urgent call back, or ask for a work or school excuse from your e-visit provider. You will get an e-mail in the next two days asking about your experience. I hope that your e-visit has been valuable and will speed your recovery.   Approximately 5 minutes was used in reviewing the patient's chart, questionnaire, prescribing medications, and documentation.

## 2020-03-06 ENCOUNTER — Other Ambulatory Visit: Payer: Self-pay

## 2020-03-06 ENCOUNTER — Ambulatory Visit (HOSPITAL_COMMUNITY)
Admission: EM | Admit: 2020-03-06 | Discharge: 2020-03-06 | Disposition: A | Discharge status: Home / Self Care | Payer: 59 | Attending: Student | Admitting: Student

## 2020-03-06 ENCOUNTER — Encounter (HOSPITAL_COMMUNITY): Payer: Self-pay | Admitting: Emergency Medicine

## 2020-03-06 ENCOUNTER — Other Ambulatory Visit (HOSPITAL_COMMUNITY): Payer: Self-pay | Admitting: Internal Medicine

## 2020-03-06 ENCOUNTER — Telehealth (HOSPITAL_COMMUNITY): Payer: Self-pay | Admitting: Emergency Medicine

## 2020-03-06 DIAGNOSIS — J069 Acute upper respiratory infection, unspecified: Secondary | ICD-10-CM | POA: Diagnosis not present

## 2020-03-06 DIAGNOSIS — J0110 Acute frontal sinusitis, unspecified: Secondary | ICD-10-CM

## 2020-03-06 MED ORDER — ONDANSETRON 4 MG PO TBDP: 4.0000 mg | ORAL_TABLET | Freq: Three times a day (TID) | ORAL | 0 refills | Status: DC | PRN

## 2020-03-06 MED ORDER — DOXYCYCLINE HYCLATE 100 MG PO CAPS: 100.0000 mg | ORAL_CAPSULE | Freq: Two times a day (BID) | ORAL | 0 refills | Status: DC

## 2020-03-06 MED ORDER — PREDNISONE 20 MG PO TABS: 40.0000 mg | ORAL_TABLET | Freq: Every day | ORAL | 0 refills | Status: DC

## 2020-03-06 MED ORDER — DOXYCYCLINE HYCLATE 100 MG PO CAPS
100.0000 mg | ORAL_CAPSULE | Freq: Two times a day (BID) | ORAL | 0 refills | Status: DC
Start: 2020-03-06 — End: 2020-03-06

## 2020-03-06 MED FILL — DOXYCYCLINE HYCLATE 100 MG: 100 | 10 days supply | Qty: 20 | Fill #0

## 2020-03-06 MED FILL — predniSONE 20 MG TABS: 20 | 5 days supply | Qty: 10 | Fill #0

## 2020-03-06 MED FILL — ONDANSETRON ODT 4 MG TABLET: 4 | 7 days supply | Qty: 20 | Fill #0

## 2020-03-06 NOTE — ED Triage Notes (Addendum)
Pt presents with cough, headache, and nausea xs 7 days. Pt states OTC medications are giving some relief.

## 2020-03-06 NOTE — Discharge Instructions (Addendum)
Take the doxycycline one in the morning and once in the evening. Use sunscreen while you're on this medication. Take two pills of prednisone in the morning for 5 days. I sent a prescription for generic Zofran; you can use this as needed for nausea.

## 2020-03-06 NOTE — ED Provider Notes (Addendum)
[facility]    CSN: [id number] Arrival date & time: 03/06/20  2119      History   Chief Complaint Chief Complaint  Patient presents with  . Headache  . Cough  . Emesis    HPI Tamara Lozano is a 49 y.o. female presenting for cough, headache, and nausea for 7 days. She had an evisit few days ago and was prescribed Zofran sublingual for nausea, which has been helpful. She's keeping fluids down though she does still have some nausea. Denies abd pain. Endorses 1/10 headache today, though this was a bit worse yesterday. Endorses dry cough. Endorses pain behind eyes. Denies facial/teeth pain, ear pain, shortness of breath, chest pain, fevers.   HPI  History reviewed. No pertinent past medical history.  There are no problems to display for this patient.   Past Surgical History:  Procedure Laterality Date  . HEMORRHOID SURGERY      OB History   No obstetric history on file.      Home Medications    Prior to Admission medications   Medication Sig Start Date End Date Taking? Authorizing Provider  acetaminophen (TYLENOL) 500 MG tablet Take 2 tablets (1,000 mg total) by mouth every 6 (six) hours as needed. 08/09/19   Darr, Gerilyn Pilgrim, PA-C  cetirizine (ZYRTEC) 10 MG tablet Take 1 tablet (10 mg total) by mouth at bedtime. 07/14/17   Zachery Dauer, NP  doxycycline (VIBRAMYCIN) 100 MG capsule Take 1 capsule (100 mg total) by mouth 2 (two) times daily. 03/06/20   Rhys Martini, PA-C  Naltrexone-buPROPion HCl ER 8-90 MG TB12 Take 1 tablet by mouth daily. 12/16/18   Helane Rima, DO  ondansetron (ZOFRAN ODT) 4 MG disintegrating tablet Take 1 tablet (4 mg total) by mouth every 8 (eight) hours as needed for nausea or vomiting. 03/04/20   Roxy Horseman, PA-C  ondansetron (ZOFRAN) 4 MG tablet Take 1 tablet (4 mg total) by mouth every 6 (six) hours. 08/09/19   Darr, Gerilyn Pilgrim, PA-C  ondansetron (ZOFRAN-ODT) 4 MG disintegrating tablet Take 1 tablet (4 mg total) by mouth every 8  (eight) hours as needed for nausea or vomiting. 03/06/20   Rhys Martini, PA-C  predniSONE (DELTASONE) 20 MG tablet Take 2 tablets (40 mg total) by mouth daily with breakfast for 5 days. 03/06/20 03/11/20  Rhys Martini, PA-C    Family History Family History  Problem Relation Age of Onset  . Heart disease Mother     Social History Social History   Tobacco Use  . Smoking status: Never Smoker  . Smokeless tobacco: Never Used  Substance Use Topics  . Alcohol use: Never  . Drug use: Never     Allergies   Penicillins   Review of Systems Review of Systems  Constitutional: Negative for chills and fever.  HENT: Positive for congestion. Negative for ear pain, sinus pressure, sinus pain, sneezing and sore throat.   Respiratory: Positive for cough. Negative for shortness of breath.   Cardiovascular: Negative for chest pain.  Gastrointestinal: Positive for nausea. Negative for abdominal distention, abdominal pain, diarrhea and vomiting.  Genitourinary: Negative for dysuria, frequency and urgency.  All other systems reviewed and are negative.    Physical Exam Triage Vital Signs ED Triage Vitals  Enc Vitals Group     BP 03/06/20 0935 120/74     Pulse Rate 03/06/20 0935 (!) 105     Resp 03/06/20 0935 16     Temp 03/06/20 0935 98.5 F (36.9 C)  Temp Source 03/06/20 0935 Oral     SpO2 03/06/20 0935 99 %     Weight --      Height --      Head Circumference --      Peak Flow --      Pain Score 03/06/20 0933 0     Pain Loc --      Pain Edu? --      Excl. in GC? --    No data found.  Updated Vital Signs BP 120/74 (BP Location: Left Arm)   Pulse (!) 105   Temp 98.5 F (36.9 C) (Oral)   Resp 16   SpO2 99%   Visual Acuity Right Eye Distance:   Left Eye Distance:   Bilateral Distance:    Right Eye Near:   Left Eye Near:    Bilateral Near:     Physical Exam Vitals reviewed.  Constitutional:      General: She is not in acute distress.    Appearance: She is  well-developed and normal weight. She is not ill-appearing.  HENT:     Head: Normocephalic and atraumatic.     Right Ear: Hearing, tympanic membrane, ear canal and external ear normal. No tenderness. No mastoid tenderness. Tympanic membrane is not erythematous, retracted or bulging.     Left Ear: Hearing, tympanic membrane, ear canal and external ear normal. No tenderness. No mastoid tenderness. Tympanic membrane is not erythematous, retracted or bulging.     Mouth/Throat:     Mouth: Mucous membranes are moist.     Pharynx: No posterior oropharyngeal erythema.     Tonsils: No tonsillar exudate or tonsillar abscesses.  Cardiovascular:     Rate and Rhythm: Normal rate and regular rhythm.     Heart sounds: Normal heart sounds.  Pulmonary:     Effort: Pulmonary effort is normal.     Breath sounds: Normal breath sounds. No wheezing, rhonchi or rales.  Abdominal:     General: There is no distension.     Palpations: Abdomen is soft.     Tenderness: There is no abdominal tenderness. There is no guarding.  Musculoskeletal:     Cervical back: Normal range of motion and neck supple.  Lymphadenopathy:     Cervical: No cervical adenopathy.  Neurological:     Mental Status: She is alert.      UC Treatments / Results  Labs (all labs ordered are listed, but only abnormal results are displayed) Labs Reviewed - No data to display  EKG   Radiology No results found.  Procedures Procedures (including critical care time)  Medications Ordered in UC Medications - No data to display  Initial Impression / Assessment and Plan / UC Course  I have reviewed the triage vital signs and the nursing notes.  Pertinent labs & imaging results that were available during my care of the patient were reviewed by me and considered in my medical decision making (see chart for details).     Pt with 7 days of sinus symptoms and nausea (controlled on sublingual zofran). Pt with penicillin allergy; doxycycline  and prednisone sent as below for sinusitis. She does not have diabetes/sugar issue. Also sent script of ondansetron as below. Return precautions- worsening nausea, inability to keep fluids down, abd pain, fevers, worsening of symptoms, etc. Patient declines covid test due to cost, but plans to pursue test through health at work.   Final Clinical Impressions(s) / UC Diagnoses   Final diagnoses:  Viral upper respiratory tract infection  Acute non-recurrent frontal sinusitis     Discharge Instructions     Take the doxycycline one in the morning and once in the evening. Use sunscreen while you're on this medication. Take two pills of prednisone in the morning for 5 days. I sent a prescription for generic Zofran; you can use this as needed for nausea.     ED Prescriptions    Medication Sig Dispense Auth. Provider   ondansetron (ZOFRAN-ODT) 4 MG disintegrating tablet Take 1 tablet (4 mg total) by mouth every 8 (eight) hours as needed for nausea or vomiting. 20 tablet Rhys Martini, PA-C   doxycycline (VIBRAMYCIN) 100 MG capsule Take 1 capsule (100 mg total) by mouth 2 (two) times daily. 20 capsule Rhys Martini, PA-C   predniSONE (DELTASONE) 20 MG tablet Take 2 tablets (40 mg total) by mouth daily with breakfast for 5 days. 10 tablet Rhys Martini, PA-C     PDMP not reviewed this encounter.   Rhys Martini, PA-C 03/06/20 1014    Rhys Martini, PA-C 03/06/20 1121

## 2020-03-06 NOTE — Telephone Encounter (Signed)
Patient wanted prescriptions sent to [facility], resent

## 2020-04-28 DIAGNOSIS — Z131 Encounter for screening for diabetes mellitus: Secondary | ICD-10-CM | POA: Diagnosis not present

## 2020-04-28 DIAGNOSIS — E663 Overweight: Secondary | ICD-10-CM | POA: Diagnosis not present

## 2020-04-28 DIAGNOSIS — Z13220 Encounter for screening for lipoid disorders: Secondary | ICD-10-CM | POA: Diagnosis not present

## 2020-04-28 DIAGNOSIS — Z13228 Encounter for screening for other metabolic disorders: Secondary | ICD-10-CM | POA: Diagnosis not present

## 2020-04-28 DIAGNOSIS — Z1329 Encounter for screening for other suspected endocrine disorder: Secondary | ICD-10-CM | POA: Diagnosis not present

## 2020-04-28 LAB — LIPID PANEL
Cholesterol: 199 (ref 0–200)
HDL: 74 — AB (ref 35–70)
LDL Cholesterol: 114
Triglycerides: 59 (ref 40–160)

## 2020-04-28 LAB — BASIC METABOLIC PANEL
BUN: 20 (ref 4–21)
CO2: 24 — AB (ref 13–22)
Chloride: 104 (ref 99–108)
Creatinine: 0.9 (ref 0.5–1.1)
Glucose: 88
Potassium: 4.4 (ref 3.4–5.3)
Sodium: 139 (ref 137–147)

## 2020-04-28 LAB — HEMOGLOBIN A1C: Hemoglobin A1C: 5.3

## 2020-04-28 LAB — HEPATIC FUNCTION PANEL
ALT: 18 (ref 7–35)
AST: 23 (ref 13–35)
Alkaline Phosphatase: 48 (ref 25–125)
Bilirubin, Total: 0.3

## 2020-04-28 LAB — COMPREHENSIVE METABOLIC PANEL
Albumin: 4.5 (ref 3.5–5.0)
Calcium: 9.5 (ref 8.7–10.7)
GFR calc Af Amer: 90
GFR calc non Af Amer: 78
Globulin: 2.6

## 2020-04-28 LAB — TSH: TSH: 1.57 (ref 0.41–5.90)

## 2020-07-02 ENCOUNTER — Telehealth: Payer: Self-pay

## 2020-07-02 NOTE — Telephone Encounter (Signed)
The patient was referred to Dr. Swaziland by Nanci Pina that has left the practice and she would like to [facility] to Dr. Swaziland.  Can I schedule her an appointment?

## 2020-07-02 NOTE — Telephone Encounter (Signed)
Okay for new patient appt?

## 2020-07-04 ENCOUNTER — Other Ambulatory Visit (HOSPITAL_COMMUNITY): Payer: Self-pay

## 2020-07-04 NOTE — Telephone Encounter (Signed)
Okay but looks like pt has new pt appt with Dr. Yetta Barre at [facility] already.

## 2020-07-04 NOTE — Telephone Encounter (Signed)
Patient is scheduled with Dr. Swaziland and going to cancel her other appointment

## 2020-07-07 ENCOUNTER — Other Ambulatory Visit (HOSPITAL_COMMUNITY): Payer: Self-pay

## 2020-07-07 MED ORDER — METFORMIN HCL 500 MG PO TABS
500.0000 mg | ORAL_TABLET | Freq: Every day | ORAL | 10 refills | Status: DC
Start: 2020-05-15 — End: 2021-06-08
  Filled 2020-07-07: qty 30, 30d supply, fill #0
  Filled 2020-08-04: qty 30, 30d supply, fill #1
  Filled 2020-09-05: qty 30, 30d supply, fill #2
  Filled 2020-10-02: qty 30, 30d supply, fill #3
  Filled 2020-11-03: qty 30, 30d supply, fill #4
  Filled 2020-12-09: qty 30, 30d supply, fill #5
  Filled 2021-01-06: qty 30, 30d supply, fill #6
  Filled 2021-02-04: qty 30, 30d supply, fill #7
  Filled 2021-03-10: qty 30, 30d supply, fill #8
  Filled 2021-04-03: qty 30, 30d supply, fill #9
  Filled 2021-05-05: qty 30, 30d supply, fill #10

## 2020-07-07 MED ORDER — OZEMPIC (0.25 OR 0.5 MG/DOSE) 2 MG/1.5ML ~~LOC~~ SOPN
0.5000 mg | PEN_INJECTOR | SUBCUTANEOUS | 0 refills | Status: DC
  Filled 2020-07-07: qty 1.5, 28d supply, fill #0

## 2020-07-14 ENCOUNTER — Other Ambulatory Visit: Payer: Self-pay

## 2020-07-15 ENCOUNTER — Ambulatory Visit (INDEPENDENT_AMBULATORY_CARE_PROVIDER_SITE_OTHER): Payer: 59 | Admitting: Family Medicine

## 2020-07-15 ENCOUNTER — Encounter: Payer: Self-pay | Admitting: Family Medicine

## 2020-07-15 VITALS — BP 120/78 | HR 94 | Temp 98.4°F | Resp 16 | Ht 68.0 in | Wt 160.4 lb

## 2020-07-15 DIAGNOSIS — Z0000 Encounter for general adult medical examination without abnormal findings: Secondary | ICD-10-CM | POA: Diagnosis not present

## 2020-07-15 DIAGNOSIS — Z1211 Encounter for screening for malignant neoplasm of colon: Secondary | ICD-10-CM | POA: Diagnosis not present

## 2020-07-15 DIAGNOSIS — K5900 Constipation, unspecified: Secondary | ICD-10-CM | POA: Diagnosis not present

## 2020-07-15 NOTE — Progress Notes (Signed)
HPI: [name] is a pleasant 50 y.o. female, who is here today to establish care.  Former PCP: Dr Earlene PlaterWallace. Last preventive routine visit: 08/24/18  Chronic medical problems: GERD,constipation, seasonal allergies. No history of hypertension, hyperlipidemia, or diabetes. She had blood work recently as part of a nutrition evaluation, reported as normal.  Constipation: Last bowel movement 5-6 days ago. She sometimes has one bowel movement every 5-6 days and sometimes daily. No associated GI symptoms. She has tried OTC miralax and caused loose stool. GERD: Asymptomatic since she changed her diet, 3 years ago.  Concerns today: She would like a CPE.  Colonoscopy: Never. Last Pap smear: 2019. Last mammogram 2019 had hair gynecologist's office. She has seen Dr. Seymour BarsLavoie. HCV screening: Never.  She exercises about 3 times per week, she walks and does the stationary bike. She follows a healthful diet: No daily problems, low sugar, and no wheat. She drinks alcohol occasionally. Negative for tobacco use.  Sleeps about 7 hours. She lives with her husband.  Immunization History  Administered Date(s) Administered  . Td 03/29/2006  . Tdap 08/25/2018   Review of Systems  Constitutional: Negative for appetite change, fatigue and fever.  HENT: Negative for dental problem, hearing loss, mouth sores, sore throat and trouble swallowing.   Eyes: Negative for redness and visual disturbance.  Respiratory: Negative for cough, shortness of breath and wheezing.   Cardiovascular: Negative for chest pain and leg swelling.  Gastrointestinal: Negative for abdominal pain, nausea and vomiting.       No changes in bowel habits.  Endocrine: Negative for cold intolerance, heat intolerance, polydipsia, polyphagia and polyuria.  Genitourinary: Negative for decreased urine volume, dysuria, hematuria, vaginal bleeding and vaginal discharge.  Musculoskeletal: Negative for gait problem and  myalgias.  Skin: Negative for color change and rash.  Allergic/Immunologic: Positive for environmental allergies.  Neurological: Negative for syncope, weakness and headaches.  Hematological: Negative for adenopathy. Does not bruise/bleed easily.  Psychiatric/Behavioral: Negative for behavioral problems and confusion.  All other systems reviewed and are negative. Rest see pertinent positives and negatives per HPI.  Current Outpatient Medications on File Prior to Visit  Medication Sig Dispense Refill  . acetaminophen (TYLENOL) 500 MG tablet Take 2 tablets (1,000 mg total) by mouth every 6 (six) hours as needed. 30 tablet 0  . cetirizine (ZYRTEC) 10 MG tablet Take 1 tablet (10 mg total) by mouth at bedtime. 30 tablet 0  . metFORMIN (GLUCOPHAGE) 500 MG tablet Take 1 tablet (500 mg total) by mouth daily with food 30 tablet 10  . predniSONE (DELTASONE) 20 MG tablet TAKE 2 TABLETS (40 MG TOTAL) BY MOUTH DAILY WITH BREAKFAST FOR 5 DAYS. 10 tablet 0  . Semaglutide,0.25 or 0.5MG /DOS, (OZEMPIC, 0.25 OR 0.5 MG/DOSE,) 2 MG/1.5ML SOPN Inject 0.5 mg into the skin once a week. 1.5 mL 0   No current facility-administered medications on file prior to visit.   History reviewed. No pertinent past medical history. Allergies  Allergen Reactions  . Penicillins     REACTION: rash   Family History  Problem Relation Age of Onset  . Heart disease Mother    Social History   Socioeconomic History  . Marital status: Married    Spouse name: Not on file  . Number of children: Not on file  . Years of education: Not on file  . Highest education level: Not on file  Occupational History    Employer: [employer]  Tobacco Use  . Smoking status: Never  Smoker  . Smokeless tobacco: Never Used  Substance and Sexual Activity  . Alcohol use: Never  . Drug use: Never  . Sexual activity: Not on file  Other Topics Concern  . Not on file  Social History Narrative  . Not on file   Social Determinants of Health    Financial Resource Strain: Not on file  Food Insecurity: Not on file  Transportation Needs: Not on file  Physical Activity: Not on file  Stress: Not on file  Social Connections: Not on file   Vitals:   07/15/20 1605  BP: 120/78  Pulse: 94  Resp: 16  Temp: 98.4 F (36.9 C)  SpO2: 98%   Body mass index is 24.39 kg/m.   Physical Exam Vitals and nursing note reviewed.  Constitutional:      General: She is not in acute distress.    Appearance: She is well-developed and normal weight.  HENT:     Head: Normocephalic and atraumatic.     Right Ear: Hearing, tympanic membrane, ear canal and external ear normal.     Left Ear: Hearing, tympanic membrane, ear canal and external ear normal.     Mouth/Throat:     Mouth: Mucous membranes are moist.     Pharynx: Oropharynx is clear. Uvula midline.  Eyes:     Extraocular Movements: Extraocular movements intact.     Conjunctiva/sclera: Conjunctivae normal.     Pupils: Pupils are equal, round, and reactive to light.  Neck:     Thyroid: No thyromegaly.     Trachea: No tracheal deviation.  Cardiovascular:     Rate and Rhythm: Normal rate and regular rhythm.     Pulses:          Dorsalis pedis pulses are 2+ on the right side and 2+ on the left side.     Heart sounds: No murmur heard.   Pulmonary:     Effort: Pulmonary effort is normal. No respiratory distress.     Breath sounds: Normal breath sounds.  Chest:  Breasts:     Right: No supraclavicular adenopathy.     Left: No supraclavicular adenopathy.    Abdominal:     Palpations: Abdomen is soft. There is no hepatomegaly or mass.     Tenderness: There is no abdominal tenderness.  Genitourinary:    Comments: Deferred to gyn. Musculoskeletal:     Comments: No major deformity or signs of synovitis appreciated.  Lymphadenopathy:     Cervical: No cervical adenopathy.     Upper Body:     Right upper body: No supraclavicular adenopathy.     Left upper body: No supraclavicular  adenopathy.  Skin:    General: Skin is warm.     Findings: No erythema or rash.  Neurological:     Mental Status: She is alert and oriented to person, place, and time.     Cranial Nerves: No cranial nerve deficit.     Coordination: Coordination normal.     Gait: Gait normal.     Deep Tendon Reflexes:     Reflex Scores:      Bicep reflexes are 2+ on the right side and 2+ on the left side.      Patellar reflexes are 2+ on the right side and 2+ on the left side. Psychiatric:        Speech: Speech normal.     Comments: Well groomed, good eye contact.   ASSESSMENT AND PLAN:  [name] was seen today for establish care and annual  exam.  Diagnoses and all orders for this visit:  Routine general medical examination at a health care facility We discussed the importance of regular physical activity and healthy diet for prevention of chronic illness and/or complications. Preventive guidelines reviewed. Continue female preventive care with her gynecologist. HCV screening with next blood work/CPE. Vaccination up to date. We will try to obtain results of recent blood work, she is going to sign a release form.  Next CPE in a year.  Colon cancer screening -     Ambulatory referral to Gastroenterology  Constipation, unspecified constipation type Problem is stable and not causing GI symptoms. Continue adequate fiber and fluid intake. OTC Benefiber twice daily may be a good option.   Return in 1 year (on 07/15/2021) for CPE.   Eulan Heyward G. [place], MD  [facility]. [place] office.  Today you have you routine preventive visit. A few things to remember from today's visit:  Routine general medical examination at a health care facility  Colon cancer screening - Plan: Ambulatory referral to Gastroenterology  Please be sure medication list is accurate. If a new problem present, please set up appointment sooner than planned today.  At least 150 minutes of moderate exercise per  week, daily brisk walking for 15-30 min is a good exercise option. Healthy diet low in saturated (animal) fats and sweets and consisting of fresh fruits and vegetables, lean meats such as fish and white chicken and whole grains.  These are some of recommendations for screening depending of age and risk factors:  - Vaccines:  Tdap vaccine every 10 years.  Shingles vaccine recommended at age 52, could be given after 51 years of age but not sure about insurance coverage.   Pneumonia vaccines: Pneumovax at 65. Sometimes Pneumovax is giving earlier if history of smoking, lung disease,diabetes,kidney disease among some.  Screening for diabetes at age 53 and every 3 years.  Cervical cancer prevention:  Pap smear starts at 51 years of age and continues periodically until 51 years old in low risk women. Pap smear every 3 years between 102 and 53 years old. Pap smear every 3-5 years between women 30 and older if pap smear negative and HPV screening negative.   -Breast cancer: Mammogram: There is disagreement between experts about when to start screening in low risk asymptomatic female but recent recommendations are to start screening at 29 and not later than 51 years old , every 1-2 years and after 51 yo q 2 years. Screening is recommended until 51 years old but some women can continue screening depending of healthy issues.  Colon cancer screening: Has been recently changed to 51 yo. Insurance may not cover until you are 51 years old. Screening is recommended until 51 years old.  Cholesterol disorder screening at age 58 and every 3 years.  Also recommended:  1. Dental visit- Brush and floss your teeth twice daily; visit your dentist twice a year. 2. Eye doctor- Get an eye exam at least every 2 years. 3. Helmet use- Always wear a helmet when riding a bicycle, motorcycle, rollerblading or skateboarding. 4. Safe sex- If you may be exposed to sexually transmitted infections, use a condom. 5. Seat  belts- Seat belts can save your live; always wear one. 6. Smoke/Carbon Monoxide detectors- These detectors need to be installed on the appropriate level of your home. Replace batteries at least once a year. 7. Skin cancer- When out in the sun please cover up and use sunscreen 15 SPF or higher. 8.  Violence- If anyone is threatening or hurting you, please tell your healthcare provider.  9. Drink alcohol in moderation- Limit alcohol intake to one drink or less per day. Never drink and drive. 10. Calcium supplementation 1000 to 1200 mg daily, ideally through your diet.  Vitamin D supplementation 800 units daily.

## 2020-07-15 NOTE — Patient Instructions (Addendum)
Today you have you routine preventive visit. A few things to remember from today's visit:  Routine general medical examination at a health care facility  Colon cancer screening - Plan: Ambulatory referral to Gastroenterology  Please be sure medication list is accurate. If a new problem present, please set up appointment sooner than planned today.  At least 150 minutes of moderate exercise per week, daily brisk walking for 15-30 min is a good exercise option. Healthy diet low in saturated (animal) fats and sweets and consisting of fresh fruits and vegetables, lean meats such as fish and white chicken and whole grains.  These are some of recommendations for screening depending of age and risk factors:  - Vaccines:  Tdap vaccine every 10 years.  Shingles vaccine recommended at age 76, could be given after 51 years of age but not sure about insurance coverage.   Pneumonia vaccines: Pneumovax at 65. Sometimes Pneumovax is giving earlier if history of smoking, lung disease,diabetes,kidney disease among some.  Screening for diabetes at age 11 and every 3 years.  Cervical cancer prevention:  Pap smear starts at 51 years of age and continues periodically until 51 years old in low risk women. Pap smear every 3 years between 40 and 17 years old. Pap smear every 3-5 years between women 30 and older if pap smear negative and HPV screening negative.   -Breast cancer: Mammogram: There is disagreement between experts about when to start screening in low risk asymptomatic female but recent recommendations are to start screening at 58 and not later than 51 years old , every 1-2 years and after 51 yo q 2 years. Screening is recommended until 51 years old but some women can continue screening depending of healthy issues.  Colon cancer screening: Has been recently changed to 51 yo. Insurance may not cover until you are 51 years old. Screening is recommended until 51 years old.  Cholesterol disorder  screening at age 26 and every 3 years.  Also recommended:  1. Dental visit- Brush and floss your teeth twice daily; visit your dentist twice a year. 2. Eye doctor- Get an eye exam at least every 2 years. 3. Helmet use- Always wear a helmet when riding a bicycle, motorcycle, rollerblading or skateboarding. 4. Safe sex- If you may be exposed to sexually transmitted infections, use a condom. 5. Seat belts- Seat belts can save your live; always wear one. 6. Smoke/Carbon Monoxide detectors- These detectors need to be installed on the appropriate level of your home. Replace batteries at least once a year. 7. Skin cancer- When out in the sun please cover up and use sunscreen 15 SPF or higher. 8. Violence- If anyone is threatening or hurting you, please tell your healthcare provider.  9. Drink alcohol in moderation- Limit alcohol intake to one drink or less per day. Never drink and drive. 10. Calcium supplementation 1000 to 1200 mg daily, ideally through your diet.  Vitamin D supplementation 800 units daily.

## 2020-07-28 ENCOUNTER — Encounter: Payer: Self-pay | Admitting: Family Medicine

## 2020-07-29 ENCOUNTER — Other Ambulatory Visit (HOSPITAL_COMMUNITY): Payer: Self-pay

## 2020-07-29 ENCOUNTER — Encounter: Payer: Self-pay | Admitting: Plastic Surgery

## 2020-07-29 ENCOUNTER — Encounter: Payer: Self-pay | Admitting: Gastroenterology

## 2020-07-29 MED ORDER — TETRACAINE POWD
TOPICAL_OINTMENT | Freq: Once | 0 refills | Status: AC
  Filled 2020-07-29: qty 60, 2d supply, fill #0

## 2020-07-31 ENCOUNTER — Ambulatory Visit (INDEPENDENT_AMBULATORY_CARE_PROVIDER_SITE_OTHER): Payer: Self-pay | Admitting: Plastic Surgery

## 2020-07-31 ENCOUNTER — Encounter: Payer: Self-pay | Admitting: Plastic Surgery

## 2020-07-31 ENCOUNTER — Other Ambulatory Visit: Payer: Self-pay

## 2020-07-31 DIAGNOSIS — Z0000 Encounter for general adult medical examination without abnormal findings: Secondary | ICD-10-CM | POA: Insufficient documentation

## 2020-07-31 DIAGNOSIS — Z719 Counseling, unspecified: Secondary | ICD-10-CM | POA: Insufficient documentation

## 2020-07-31 NOTE — Progress Notes (Signed)
Sc[place] Halo  The 15 x 45 handpiece was used with a fluence of 4-6, pulse width of 3 and chill temperature of 20 and 128 shots given.  Post care instructions given and reviewed.

## 2020-08-05 ENCOUNTER — Other Ambulatory Visit (HOSPITAL_COMMUNITY): Payer: Self-pay

## 2020-08-19 ENCOUNTER — Other Ambulatory Visit (HOSPITAL_COMMUNITY): Payer: Self-pay

## 2020-08-20 ENCOUNTER — Other Ambulatory Visit (HOSPITAL_COMMUNITY): Payer: Self-pay

## 2020-08-22 ENCOUNTER — Other Ambulatory Visit (HOSPITAL_COMMUNITY): Payer: Self-pay

## 2020-09-03 ENCOUNTER — Other Ambulatory Visit (HOSPITAL_COMMUNITY): Payer: Self-pay

## 2020-09-05 ENCOUNTER — Other Ambulatory Visit (HOSPITAL_COMMUNITY): Payer: Self-pay

## 2020-09-09 ENCOUNTER — Other Ambulatory Visit (HOSPITAL_COMMUNITY): Payer: Self-pay

## 2020-09-15 ENCOUNTER — Other Ambulatory Visit (HOSPITAL_COMMUNITY): Payer: Self-pay

## 2020-09-15 ENCOUNTER — Other Ambulatory Visit: Payer: Self-pay

## 2020-09-15 ENCOUNTER — Ambulatory Visit (AMBULATORY_SURGERY_CENTER): Payer: 59

## 2020-09-15 VITALS — Ht 68.0 in | Wt 155.0 lb

## 2020-09-15 DIAGNOSIS — Z1211 Encounter for screening for malignant neoplasm of colon: Secondary | ICD-10-CM

## 2020-09-15 MED ORDER — NA SULFATE-K SULFATE-MG SULF 17.5-3.13-1.6 GM/177ML PO SOLN
1.0000 | ORAL | 0 refills | Status: DC
  Filled 2020-09-15: qty 354, 1d supply, fill #0

## 2020-09-15 NOTE — Progress Notes (Signed)
Pt has constipation.  She was encouraged to take OTC miralax 1 capful 1-2 daily for 5 days before the colonoscopy.     Patient is here in-person for PV. Patient denies any allergies to eggs or soy. Patient denies any problems with anesthesia/sedation. Patient denies any oxygen use at home. Patient denies taking any diet/weight loss medications or blood thinners. Patient is not being treated for MRSA or C-diff. Patient is aware of our care-partner policy and Covid-19 safety protocol. EMMI education assigned to the patient for the procedure, sent to MyChart.   Patient is COVID-19 vaccinated, per patient.

## 2020-09-16 ENCOUNTER — Other Ambulatory Visit (HOSPITAL_COMMUNITY): Payer: Self-pay

## 2020-10-01 ENCOUNTER — Ambulatory Visit: Payer: 59 | Admitting: Internal Medicine

## 2020-10-03 ENCOUNTER — Other Ambulatory Visit (HOSPITAL_COMMUNITY): Payer: Self-pay

## 2020-10-06 ENCOUNTER — Ambulatory Visit (AMBULATORY_SURGERY_CENTER): Payer: 59 | Admitting: Gastroenterology

## 2020-10-06 ENCOUNTER — Encounter: Payer: Self-pay | Admitting: Gastroenterology

## 2020-10-06 ENCOUNTER — Other Ambulatory Visit: Payer: Self-pay

## 2020-10-06 VITALS — BP 115/64 | HR 70 | Temp 97.3°F | Resp 13 | Ht 68.0 in | Wt 155.0 lb

## 2020-10-06 DIAGNOSIS — Z1211 Encounter for screening for malignant neoplasm of colon: Secondary | ICD-10-CM

## 2020-10-06 MED ORDER — SODIUM CHLORIDE 0.9 % IV SOLN
500.0000 mL | Freq: Once | INTRAVENOUS | Status: DC
Start: 2020-10-06 — End: 2020-10-06

## 2020-10-06 NOTE — Patient Instructions (Signed)
YOU HAD AN ENDOSCOPIC PROCEDURE TODAY AT THE [facility]:   Refer to the procedure report that was given to you for any specific questions about what was found during the examination.  If the procedure report does not answer your questions, please call your gastroenterologist to clarify.  If you requested that your care partner not be given the details of your procedure findings, then the procedure report has been included in a sealed envelope for you to review at your convenience later.  YOU SHOULD EXPECT: Some feelings of bloating in the abdomen. Passage of more gas than usual.  Walking can help get rid of the air that was put into your GI tract during the procedure and reduce the bloating. If you had a lower endoscopy (such as a colonoscopy or flexible sigmoidoscopy) you may notice spotting of blood in your stool or on the toilet paper. If you underwent a bowel prep for your procedure, you may not have a normal bowel movement for a few days.  Please Note:  You might notice some irritation and congestion in your nose or some drainage.  This is from the oxygen used during your procedure.  There is no need for concern and it should clear up in a day or so.  SYMPTOMS TO REPORT IMMEDIATELY:   Following lower endoscopy (colonoscopy or flexible sigmoidoscopy):  Excessive amounts of blood in the stool  Significant tenderness or worsening of abdominal pains  Swelling of the abdomen that is new, acute  Fever of 100F or higher   Following upper endoscopy (EGD)  Vomiting of blood or coffee ground material  New chest pain or pain under the shoulder blades  Painful or persistently difficult swallowing  New shortness of breath  Fever of 100F or higher  Black, tarry-looking stools  For urgent or emergent issues, a gastroenterologist can be reached at any hour by calling [phone]. Do not use MyChart messaging for urgent concerns.    DIET:  We do recommend a small meal at first, but  then you may proceed to your regular diet.  Drink plenty of fluids but you should avoid alcoholic beverages for 24 hours.  ACTIVITY:  You should plan to take it easy for the rest of today and you should NOT DRIVE or use heavy machinery until tomorrow (because of the sedation medicines used during the test).    FOLLOW UP: Our staff will call the number listed on your records 48-72 hours following your procedure to check on you and address any questions or concerns that you may have regarding the information given to you following your procedure. If we do not reach you, we will leave a message.  We will attempt to reach you two times.  During this call, we will ask if you have developed any symptoms of COVID 19. If you develop any symptoms (ie: fever, flu-like symptoms, shortness of breath, cough etc.) before then, please call [phone].  If you test positive for Covid 19 in the 2 weeks post procedure, please call and report this information to us.    If any biopsies were taken you will be contacted by phone or by letter within the next 1-3 weeks.  Please call us at [phone] if you have not heard about the biopsies in 3 weeks.    SIGNATURES/CONFIDENTIALITY: You and/or your care partner have signed paperwork which will be entered into your electronic medical record.  These signatures attest to the fact that that the information above on   your After Visit Summary has been reviewed and is understood.  Full responsibility of the confidentiality of this discharge information lies with you and/or your care-partner. 

## 2020-10-06 NOTE — Progress Notes (Signed)
C.W. vital signs. 

## 2020-10-06 NOTE — Progress Notes (Signed)
To PACU, VSS. Report to Rn.tb 

## 2020-10-06 NOTE — Op Note (Signed)
[facility] Patient Name: Tamara Lozano Procedure Date: 10/06/2020 7:57 AM MRN: [id number] Endoscopist: Sherilyn Cooter L. Myrtie Neither , MD Age: 50 Referring MD:  Date of Birth: [birth year] Gender: Female Account #: [id number] Procedure:                Colonoscopy Indications:              Screening for colorectal malignant neoplasm, This                            is the patient's first colonoscopy Medicines:                Monitored Anesthesia Care Procedure:                Pre-Anesthesia Assessment:                           - Prior to the procedure, a History and Physical                            was performed, and patient medications and                            allergies were reviewed. The patient's tolerance of                            previous anesthesia was also reviewed. The risks                            and benefits of the procedure and the sedation                            options and risks were discussed with the patient.                            All questions were answered, and informed consent                            was obtained. Prior Anticoagulants: The patient has                            taken no previous anticoagulant or antiplatelet                            agents. ASA Grade Assessment: II - A patient with                            mild systemic disease. After reviewing the risks                            and benefits, the patient was deemed in                            satisfactory condition to undergo the procedure.  After obtaining informed consent, the colonoscope                            was passed under direct vision. Throughout the                            procedure, the patient's blood pressure, pulse, and                            oxygen saturations were monitored continuously. The                            CF [id number] #[id number] was introduced through the anus                            and advanced to the  the cecum, identified by                            appendiceal orifice and ileocecal valve. The                            colonoscopy was somewhat difficult due to poor                            bowel prep, a redundant colon and significant                            looping. Successful completion of the procedure was                            aided by changing the patient to a semi-supine                            position and lavage. The patient tolerated the                            procedure well. The quality of the bowel                            preparation was poor. The ileocecal valve,                            appendiceal orifice, and rectum were photographed.                            The bowel preparation used was SUPREP (with miralax                            twice daily for several days prior to prep). Scope In: 8:06:39 AM Scope Out: 8:25:41 AM Scope Withdrawal Time: 0 hours 13 minutes 10 seconds  Total Procedure Duration: 0 hours 19 minutes 2 seconds  Findings:                 Prolapsed internal hemorrhoids  were found on                            perianal exam.                           A large amount of semi-liquid stool was found in                            the entire colon, interfering with visualization.                            Lavage of the area was performed using a large                            amount, resulting in incomplete clearance with                            continued poor visualization.                           Internal hemorrhoids were found. Scope trauma to                            hemorrhoidal area caused self-limited bleeding.                           The exam was otherwise without abnormality on                            direct and retroflexion views. Complications:            No immediate complications. Estimated Blood Loss:     Estimated blood loss was minimal. Impression:               - Preparation of the colon was poor  despite lavage.                           - Internal hemorrhoids found on perianal exam.                           - Stool in the entire examined colon.                           - The examination was otherwise normal on direct                            and retroflexion views.                           - No specimens collected. Recommendation:           - Patient has a contact number available for                            emergencies. The signs and symptoms of potential  delayed complications were discussed with the                            patient. Return to normal activities tomorrow.                            Written discharge instructions were provided to the                            patient.                           - Resume previous diet.                           - Continue present medications.                           - Repeat colonoscopy in 1 year for screening                            purposes. 4 liter split dose PEG prep for next exam                            after patient is on treatment for constipation.                           - Preparation H suppository: Insert rectally BID                            for 1 week for acute hemorrhoidal bleeding.                           - Return to my office at appointment to be                            scheduled to address chronic constipation and                            hemorrhoids. Chay Mazzoni L. Myrtie Neither, MD 10/06/2020 8:37:27 AM This report has been signed electronically.

## 2020-10-06 NOTE — Progress Notes (Signed)
Pt's states no medical or surgical changes since previsit or office visit. 

## 2020-10-07 ENCOUNTER — Telehealth: Payer: Self-pay

## 2020-10-07 NOTE — Telephone Encounter (Signed)
Per 10/06/20 procedure report- Return to office at appt to be scheduled for chronic constipation and hemorrhoids  Spoke with patient, she has been scheduled for a follow up with Dr. Myrtie Neither on Friday, 7/22 at 8:40 AM. Patient verbalized understanding and had no concerns at the end of the call.

## 2020-10-08 ENCOUNTER — Telehealth: Payer: Self-pay

## 2020-10-08 NOTE — Telephone Encounter (Signed)
  Follow up Call-  Call back number 10/06/2020  Post procedure Call Back phone  # [phone]  Permission to leave phone message Yes  Some recent data might be hidden     Patient questions:  Do you have a fever, pain , or abdominal swelling? No. Pain Score  0 *  Have you tolerated food without any problems? Yes.    Have you been able to return to your normal activities? Yes.    Do you have any questions about your discharge instructions: Diet   No. Medications  No. Follow up visit  No.  Do you have questions or concerns about your Care? No.  Actions: * If pain score is 4 or above: No action needed, pain <4.  Have you developed a fever since your procedure? no  2.   Have you had an respiratory symptoms (SOB or cough) since your procedure? no  3.   Have you tested positive for COVID 19 since your procedure no  4.   Have you had any family members/close contacts diagnosed with the COVID 19 since your procedure?  no   If yes to any of these questions please route to Laverna Peace, RN and Karlton Lemon, RN

## 2020-10-17 ENCOUNTER — Encounter: Payer: Self-pay | Admitting: Gastroenterology

## 2020-10-17 ENCOUNTER — Ambulatory Visit: Payer: 59 | Admitting: Gastroenterology

## 2020-10-17 VITALS — BP 102/62 | HR 90 | Ht 68.0 in | Wt 164.5 lb

## 2020-10-17 DIAGNOSIS — K648 Other hemorrhoids: Secondary | ICD-10-CM

## 2020-10-17 DIAGNOSIS — K5904 Chronic idiopathic constipation: Secondary | ICD-10-CM

## 2020-10-17 MED ORDER — MOTEGRITY 2 MG PO TABS: 1.0000 | ORAL_TABLET | Freq: Every day | ORAL | 0 refills | Status: DC

## 2020-10-17 NOTE — Patient Instructions (Signed)
We have given you samples of the following medication to take: Motegrity 2 mg  by mouth once daily  Contact us by either MyChart or phone at [phone] to let us know how the Motegrity is working for you after taking it for a couple of weeks. If it works well, we can send a prescription for you.  If you are age 51 or older, your body mass index should be between 23-30. Your Body mass index is 25.01 kg/m. If this is out of the aforementioned range listed, please consider follow up with your Primary Care Provider.  If you are age 85 or younger, your body mass index should be between 19-25. Your Body mass index is 25.01 kg/m. If this is out of the aformentioned range listed, please consider follow up with your Primary Care Provider.   __________________________________________________________  The [facility] providers would like to encourage you to use [facility] to communicate with providers for non-urgent requests or questions.  Due to long hold times on the telephone, sending your provider a message by [facility] may be a faster and more efficient way to get a response.  Please allow 48 business hours for a response.  Please remember that this is for non-urgent requests.   Due to recent changes in healthcare laws, you may see the results of your imaging and laboratory studies on MyChart before your provider has had a chance to review them.  We understand that in some cases there may be results that are confusing or concerning to you. Not all laboratory results come back in the same time frame and the provider may be waiting for multiple results in order to interpret others.  Please give [place] 48 hours in order for your provider to thoroughly review all the results before contacting the office for clarification of your results.

## 2020-10-17 NOTE — Progress Notes (Signed)
     [facility] Progress Note  Chief Complaint: Chronic constipation  Subjective  History:  Temia follows up after her recent colonoscopy.  She was seen as a direct booked screening colonoscopy, and unfortunately bowel preparation was poor.  Talking to her afterwards it sounds like she has longstanding severe constipation with a BM on average once a week, and even then usually passes small pellet-like stools with prolonged periods of sitting and sometimes straining.  She has had intermittent hemorrhoidal bleeding for years as well, and recalls that Dr.Toth did a hemorrhoid procedure years ago.  It may have decreased bleeding for some time, but she also recalls that being painful (?  Banding versus surgical intervention).  ROS: Cardiovascular:  no chest pain Respiratory: no dyspnea  The patient's Past Medical, Family and Social History were reviewed and are on file in the EMR.  Objective:  Med list reviewed  Current Outpatient Medications:    acetaminophen (TYLENOL) 500 MG tablet, Take 2 tablets (1,000 mg total) by mouth every 6 (six) hours as needed., Disp: 30 tablet, Rfl: 0   loratadine (CLARITIN) 10 MG tablet, Take 10 mg by mouth daily., Disp: , Rfl:    MAGNESIUM PO, Take by mouth daily. Also has Melatonin in the magnesium, Disp: , Rfl:    metFORMIN (GLUCOPHAGE) 500 MG tablet, Take 1 tablet (500 mg total) by mouth daily with food, Disp: 30 tablet, Rfl: 10   Multiple Vitamin (MULTIVITAMIN) tablet, Take 1 tablet by mouth daily., Disp: , Rfl:    NON FORMULARY, Smooth Move-tea drink one glass daily, Disp: , Rfl:    polyethylene glycol (MIRALAX / GLYCOLAX) 17 g packet, Take 17 g by mouth daily as needed., Disp: , Rfl:    Prucalopride Succinate (MOTEGRITY) 2 MG TABS, Take 1 tablet (2 mg total) by mouth daily., Disp: 21 tablet, Rfl: 0   UNABLE TO FIND, Med Name: Nicotinamide Mononucleotide - Supplement for longevity, Disp: , Rfl:    UNABLE TO FIND, Med Name: Pre & post biotic take  daily, Disp: , Rfl:    Vital signs in last 24 hrs: Vitals:   10/17/20 0843  BP: 102/62  Pulse: 90  SpO2: 98%   Wt Readings from Last 3 Encounters:  10/17/20 164 lb 8 oz (74.6 kg)  10/06/20 155 lb (70.3 kg)  09/15/20 155 lb (70.3 kg)    Physical Exam  Cardiac: RRR without murmurs, S1S2 heard, no peripheral edema Pulm: clear to auscultation bilaterally, normal RR and effort noted Abdomen: soft, no tenderness, with active bowel sounds. No guarding or palpable hepatosplenomegaly. Rectal: Grade 2-3 prolapsed internal hemorrhoids with decreased resting sphincter tone  Labs:   ___________________________________________ Radiologic studies:   ____________________________________________ Other:   _____________________________________________ Assessment & Plan  Assessment: Encounter Diagnoses  Name Primary?   Chronic idiopathic constipation Yes   Bleeding internal hemorrhoids    CIC based on description.  She most often does not feel the urge for BM.  After about a week gets a bloated and uncomfortable.  History not consistent with pelvic floor dysfunction or rectocele.   Plan: Motegrity 2 mg daily (2 weeks of samples given). She will contact me via nurse call or portal message to let me know how that is going, and we can make dose adjustment or medicine change if needed. After some hopeful improvement in the constipation, hemorrhoids to be readdressed.  Start with banding, if unsuccessful refer to colorectal surgery for hemorrhoidectomy.   Charlie Pitter III

## 2020-10-24 DIAGNOSIS — Z20828 Contact with and (suspected) exposure to other viral communicable diseases: Secondary | ICD-10-CM | POA: Diagnosis not present

## 2020-10-29 ENCOUNTER — Other Ambulatory Visit: Payer: Self-pay

## 2020-10-29 MED ORDER — MOTEGRITY 2 MG PO TABS: 1.0000 | ORAL_TABLET | Freq: Every day | ORAL | 0 refills | Status: DC

## 2020-10-30 ENCOUNTER — Encounter: Payer: Self-pay | Admitting: Internal Medicine

## 2020-10-30 ENCOUNTER — Other Ambulatory Visit: Payer: Self-pay

## 2020-10-30 ENCOUNTER — Ambulatory Visit: Payer: 59 | Admitting: Internal Medicine

## 2020-10-30 ENCOUNTER — Other Ambulatory Visit (HOSPITAL_COMMUNITY): Payer: Self-pay

## 2020-10-30 VITALS — BP 110/68 | HR 72 | Temp 98.0°F | Wt 163.8 lb

## 2020-10-30 DIAGNOSIS — R059 Cough, unspecified: Secondary | ICD-10-CM | POA: Diagnosis not present

## 2020-10-30 MED ORDER — BENZONATATE 100 MG PO CAPS
100.0000 mg | ORAL_CAPSULE | Freq: Two times a day (BID) | ORAL | 0 refills | Status: DC | PRN
Start: 2020-10-30 — End: 2021-07-17
  Filled 2020-10-30: qty 20, 10d supply, fill #0

## 2020-10-30 MED ORDER — MOTEGRITY 2 MG PO TABS
1.0000 | ORAL_TABLET | Freq: Every day | ORAL | 0 refills | Status: DC
  Filled 2020-10-30 – 2020-12-03 (×2): qty 30, 30d supply, fill #0

## 2020-10-30 NOTE — Progress Notes (Signed)
Acute office Visit     This visit occurred during the SARS-CoV-2 public health emergency.  Safety protocols were in place, including screening questions prior to the visit, additional usage of staff PPE, and extensive cleaning of exam room while observing appropriate contact time as indicated for disinfecting solutions.    CC/Reason for Visit: Cough  HPI: Tamara Lozano is a 50 y.o. female who is coming in today for the above mentioned reasons.  She is here today for evaluation of a cough that has been present for about 2 weeks.  It is dry in nature.  She has not had any other symptoms such as fever, chills, body aches, other URI symptoms.  No new medications.  She is only on metformin and a medication for constipation called Motegrity.  No recent travel or sick contacts.  She did have a COVID PCR about a week into her symptoms that was negative.  Past Medical/Surgical History: Past Medical History:  Diagnosis Date   Allergy    Blood transfusion without reported diagnosis    GERD (gastroesophageal reflux disease)    Internal hemorrhoids     Past Surgical History:  Procedure Laterality Date   HEMORRHOID SURGERY      Social History:  reports that she has never smoked. She has never used smokeless tobacco. She reports current alcohol use. She reports that she does not use drugs.  Allergies: Allergies  Allergen Reactions   Penicillins     REACTION: rash    Family History:  Family History  Problem Relation Age of Onset   Heart disease Mother    Colon cancer Neg Hx    Colon polyps Neg Hx    Esophageal cancer Neg Hx    Rectal cancer Neg Hx    Stomach cancer Neg Hx      Current Outpatient Medications:    acetaminophen (TYLENOL) 500 MG tablet, Take 2 tablets (1,000 mg total) by mouth every 6 (six) hours as needed., Disp: 30 tablet, Rfl: 0   benzonatate (TESSALON) 100 MG capsule, Take 1 capsule (100 mg total) by mouth 2 (two) times daily as needed for cough., Disp:  20 capsule, Rfl: 0   loratadine (CLARITIN) 10 MG tablet, Take 10 mg by mouth daily., Disp: , Rfl:    MAGNESIUM PO, Take by mouth daily. Also has Melatonin in the magnesium, Disp: , Rfl:    metFORMIN (GLUCOPHAGE) 500 MG tablet, Take 1 tablet (500 mg total) by mouth daily with food, Disp: 30 tablet, Rfl: 10   Multiple Vitamin (MULTIVITAMIN) tablet, Take 1 tablet by mouth daily., Disp: , Rfl:    NON FORMULARY, Smooth Move-tea drink one glass daily, Disp: , Rfl:    Prucalopride Succinate (MOTEGRITY) 2 MG TABS, Take 1 tablet (2 mg total) by mouth daily., Disp: 30 tablet, Rfl: 0   UNABLE TO FIND, Med Name: Pre & post biotic take daily, Disp: , Rfl:   Review of Systems:  Constitutional: Denies fever, chills, diaphoresis, appetite change and fatigue.  HEENT: Denies photophobia, eye pain, redness, hearing loss, ear pain, congestion, sore throat, rhinorrhea, sneezing, mouth sores, trouble swallowing, neck pain, neck stiffness and tinnitus.   Respiratory: Denies SOB, DOE,  chest tightness,  and wheezing.   Cardiovascular: Denies chest pain, palpitations and leg swelling.  Gastrointestinal: Denies nausea, vomiting, abdominal pain, diarrhea, constipation, blood in stool and abdominal distention.  Genitourinary: Denies dysuria, urgency, frequency, hematuria, flank pain and difficulty urinating.  Endocrine: Denies: hot or cold intolerance, sweats, changes  in hair or nails, polyuria, polydipsia. Musculoskeletal: Denies myalgias, back pain, joint swelling, arthralgias and gait problem.  Skin: Denies pallor, rash and wound.  Neurological: Denies dizziness, seizures, syncope, weakness, light-headedness, numbness and headaches.  Hematological: Denies adenopathy. Easy bruising, personal or family bleeding history  Psychiatric/Behavioral: Denies suicidal ideation, mood changes, confusion, nervousness, sleep disturbance and agitation    Physical Exam: Vitals:   10/30/20 0659  BP: 110/68  Pulse: 72  Temp: 98  F (36.7 C)  TempSrc: Oral  SpO2: 99%  Weight: 163 lb 12.8 oz (74.3 kg)    Body mass index is 24.91 kg/m.   Constitutional: NAD, calm, comfortable Eyes: PERRL, lids and conjunctivae normal ENMT: Mucous membranes are moist. Posterior pharynx clear of any exudate or lesions. Normal dentition. Tympanic membrane is pearly white, no erythema or bulging. Respiratory: clear to auscultation bilaterally, no wheezing, no crackles. Normal respiratory effort. No accessory muscle use.  Cardiovascular: Regular rate and rhythm, no murmurs / rubs / gallops. No extremity edema.  Neurologic: Grossly intact and nonfocal Psychiatric: Normal judgment and insight. Alert and oriented x 3. Normal mood.    Impression and Plan:  Cough  - Plan: benzonatate (TESSALON) 100 MG capsule -Given exam findings, PNA, pharyngitis, ear infection are not likely, hence abx have not been prescribed. -Have advised rest, fluids, OTC antihistamines, cough suppressants and mucinex. -RTC if no improvement in 10-14 days.     Chaya Jan, MD [facility] at [facility]

## 2020-10-30 NOTE — Telephone Encounter (Signed)
Patient calling regarding refill of Motegrity. Rx was ordered by Dr. Myrtie Neither yesterday, however it was marked as "sample" so it did not get sent to the pharmacy.  Rx changed to normal and sent.

## 2020-11-03 ENCOUNTER — Other Ambulatory Visit (HOSPITAL_COMMUNITY): Payer: Self-pay

## 2020-11-03 ENCOUNTER — Telehealth: Payer: Self-pay | Admitting: Gastroenterology

## 2020-11-03 NOTE — Telephone Encounter (Signed)
PA has been submitted with cover my meds.  Spoke to [place] and explained that we have to do a PA. We have no current samples in stock

## 2020-11-03 NOTE — Telephone Encounter (Signed)
I received a chat message about this through Epic from a pharmacist late last week.  I do not recall which pharmacist it was, and unfortunately that message has automatically disappeared from my chat inbox.  I suspect it was one of the pharmacists at the [facility] location of the [facility] employee pharmacy.  I believe the message indicated this patient might need a trial of another medicine (?  Linzess or other) before Motegrity can be approved. Please contact the pharmacist there and see if more specific information can be obtained.  Please send a portal message to the patient and see if she can find out from the pharmacist how much it would cost to cash pay for a small supply of the medication, since it reportedly worked very well for her and [place].  If we get any samples this week, please contact the patient and offer some to her.  - HD

## 2020-11-03 NOTE — Telephone Encounter (Signed)
Pt called stating that prescription for Motegrity was denied by her insurance. Pt wants to know the reason why. She is wondering if it was because it was sent for constipation instead of motility issues. Pt will be traveling abroad on Friday and is asking for more samples if she will not be able to have prescription before Friday. Pls call her.

## 2020-11-05 NOTE — Telephone Encounter (Signed)
Spoke to Tamara Lozano and she agrees to try the [employer] for insurance requirements, I also gave her 30 days supply of Motegrity as [place] starting [place]

## 2020-11-05 NOTE — Telephone Encounter (Signed)
Left a detailed message for the patient and asked for a return call to discuss further.

## 2020-11-05 NOTE — Telephone Encounter (Signed)
Patient returned the calls said she would be ok with Linzess and that she will be in meetings all day but if you need to call her back she will try to answer.

## 2020-11-06 DIAGNOSIS — Z03818 Encounter for observation for suspected exposure to other biological agents ruled out: Secondary | ICD-10-CM | POA: Diagnosis not present

## 2020-11-06 NOTE — Telephone Encounter (Signed)
Tamara Lozano states that she tried the Linzess and it was like taking a bowel prep and caused horrible diarrhea.

## 2020-11-06 NOTE — Telephone Encounter (Signed)
PA submitted and approved. [place] has been notified and aware.

## 2020-12-03 ENCOUNTER — Other Ambulatory Visit (HOSPITAL_COMMUNITY): Payer: Self-pay

## 2020-12-04 ENCOUNTER — Telehealth: Payer: Self-pay | Admitting: Gastroenterology

## 2020-12-04 ENCOUNTER — Other Ambulatory Visit (HOSPITAL_COMMUNITY): Payer: Self-pay

## 2020-12-04 NOTE — Telephone Encounter (Signed)
Inbound call from patient. States motegrity is still over $100 copay and requesting if she can get a discount code or card if it is offered.

## 2020-12-04 NOTE — Telephone Encounter (Signed)
Pt will come to the office to pick up a coupon

## 2020-12-09 ENCOUNTER — Other Ambulatory Visit (HOSPITAL_COMMUNITY): Payer: Self-pay

## 2021-01-02 ENCOUNTER — Other Ambulatory Visit: Payer: Self-pay | Admitting: Gastroenterology

## 2021-01-02 ENCOUNTER — Other Ambulatory Visit (HOSPITAL_COMMUNITY): Payer: Self-pay

## 2021-01-02 MED ORDER — MOTEGRITY 2 MG PO TABS
1.0000 | ORAL_TABLET | Freq: Every day | ORAL | 2 refills | Status: DC
  Filled 2021-01-02: qty 30, 30d supply, fill #0
  Filled 2021-02-03: qty 30, 30d supply, fill #1
  Filled 2021-03-05: qty 30, 30d supply, fill #2

## 2021-01-05 ENCOUNTER — Other Ambulatory Visit (HOSPITAL_COMMUNITY): Payer: Self-pay

## 2021-01-05 NOTE — Telephone Encounter (Signed)
Samples have been placed at the 3rd floor receptionist desk for patient to pick up. Pt notified via my chart. 2 boxes of Linzess 145 mcg - LOT # [id number], EXP: 08/2021 2 boxes of Linzess 290 mcg - LOT # [id number], EXP: 07/2021

## 2021-01-06 ENCOUNTER — Other Ambulatory Visit (HOSPITAL_COMMUNITY): Payer: Self-pay

## 2021-02-03 ENCOUNTER — Other Ambulatory Visit (HOSPITAL_COMMUNITY): Payer: Self-pay

## 2021-02-04 ENCOUNTER — Other Ambulatory Visit (HOSPITAL_COMMUNITY): Payer: Self-pay

## 2021-02-06 ENCOUNTER — Encounter: Payer: 59 | Admitting: Family Medicine

## 2021-02-27 ENCOUNTER — Encounter: Payer: 59 | Admitting: Family Medicine

## 2021-03-05 ENCOUNTER — Other Ambulatory Visit (HOSPITAL_COMMUNITY): Payer: Self-pay

## 2021-03-10 ENCOUNTER — Other Ambulatory Visit (HOSPITAL_COMMUNITY): Payer: Self-pay

## 2021-04-03 ENCOUNTER — Other Ambulatory Visit (HOSPITAL_COMMUNITY): Payer: Self-pay

## 2021-04-03 ENCOUNTER — Other Ambulatory Visit: Payer: Self-pay

## 2021-04-03 ENCOUNTER — Other Ambulatory Visit: Payer: Self-pay | Admitting: Gastroenterology

## 2021-04-03 MED ORDER — MOTEGRITY 2 MG PO TABS
1.0000 | ORAL_TABLET | Freq: Every day | ORAL | 2 refills | Status: DC
  Filled 2021-04-03: qty 30, 30d supply, fill #0
  Filled 2021-05-05: qty 30, 30d supply, fill #1
  Filled 2021-06-08: qty 30, 30d supply, fill #2

## 2021-05-05 ENCOUNTER — Other Ambulatory Visit (HOSPITAL_COMMUNITY): Payer: Self-pay

## 2021-06-08 ENCOUNTER — Other Ambulatory Visit (HOSPITAL_COMMUNITY): Payer: Self-pay

## 2021-06-08 ENCOUNTER — Other Ambulatory Visit: Payer: Self-pay | Admitting: Family Medicine

## 2021-06-08 MED ORDER — METFORMIN HCL 500 MG PO TABS
500.0000 mg | ORAL_TABLET | Freq: Every day | ORAL | 2 refills | Status: DC
  Filled 2021-06-08: qty 30, 30d supply, fill #0
  Filled 2021-07-08: qty 30, 30d supply, fill #1

## 2021-07-08 ENCOUNTER — Other Ambulatory Visit: Payer: Self-pay | Admitting: Gastroenterology

## 2021-07-09 ENCOUNTER — Other Ambulatory Visit (HOSPITAL_COMMUNITY): Payer: Self-pay

## 2021-07-09 MED ORDER — MOTEGRITY 2 MG PO TABS
1.0000 | ORAL_TABLET | Freq: Every day | ORAL | 2 refills | Status: DC
  Filled 2021-07-09: qty 30, 30d supply, fill #0
  Filled 2021-08-08: qty 30, 30d supply, fill #1
  Filled 2021-09-12: qty 30, 30d supply, fill #2

## 2021-07-15 NOTE — Progress Notes (Signed)
? ?HPI: ?[name] is a 51 y.o. female, who is here today for her routine physical. ? ?Last CPE: 07/15/20 ?No new problems since her last visit. ? ?Regular exercise: She is walking 3-3.5 miles 4-5 times per week. ?Following a healthful diet: She cooks her meals most of the time. Plenty of vegetable and fruits, decreased beef intake. She eats chicken breast and fish. ? ?Chronic medical problems: Constipation, GERD, and seasonal allergies among some. ?No hx of diabetes,HLD, or CVD. ?She would like TSH/labs done today. ? ?Still dealing with constipation, she follows with GI. ? ?Immunization History  ?Administered Date(s) Administered  ? Influenza,inj,Quad PF,6+ Mos 01/07/2021  ? Td 03/29/2006  ? Tdap 08/25/2018  ? ?Health Maintenance  ?Topic Date Due  ? Hepatitis C Screening  Never done  ? MAMMOGRAM  12/01/2019  ? Zoster Vaccines- Shingrix (1 of 2) 10/16/2021 (Originally 12/01/2019)  ? COLONOSCOPY (Pts 45-27yrs Insurance coverage will need to be confirmed)  10/06/2021  ? INFLUENZA VACCINE  10/27/2021  ? PAP SMEAR-Modifier  08/16/2022  ? TETANUS/TDAP  08/24/2028  ? HIV Screening  Completed  ? HPV VACCINES  Aged Out  ? ?She is established with gynecologist, Dr. Dellis Lozano. ?Last pap smear in 2019. ?No hx of abnormal pap smears. ? ?She has no concerns today. ? ?Review of Systems  ?Constitutional:  Negative for appetite change, fatigue and fever.  ?HENT:  Negative for hearing loss, mouth sores, sore throat, trouble swallowing and voice change.   ?Eyes:  Negative for redness and visual disturbance.  ?Respiratory:  Negative for cough, shortness of breath and wheezing.   ?Cardiovascular:  Negative for chest pain and leg swelling.  ?Gastrointestinal:  Negative for abdominal pain, nausea and vomiting.  ?     No changes in bowel habits.  ?Endocrine: Negative for cold intolerance, heat intolerance, polydipsia, polyphagia and polyuria.  ?Genitourinary:  Negative for decreased urine volume, dysuria, hematuria, vaginal  bleeding and vaginal discharge.  ?Musculoskeletal:  Negative for gait problem and myalgias.  ?Skin:  Negative for color change and rash.  ?Allergic/Immunologic: Positive for environmental allergies.  ?Neurological:  Negative for syncope, weakness and headaches.  ?Hematological:  Negative for adenopathy. Does not bruise/bleed easily.  ?Psychiatric/Behavioral:  Negative for confusion. The patient is not nervous/anxious.   ?All other systems reviewed and are negative. ? ?Current Outpatient Medications on File Prior to Visit  ?Medication Sig Dispense Refill  ? acetaminophen (TYLENOL) 500 MG tablet Take 2 tablets (1,000 mg total) by mouth every 6 (six) hours as needed. 30 tablet 0  ? loratadine (CLARITIN) 10 MG tablet Take 10 mg by mouth daily.    ? MAGNESIUM PO Take by mouth daily. Also has Melatonin in the magnesium    ? Multiple Vitamin (MULTIVITAMIN) tablet Take 1 tablet by mouth daily.    ? NON FORMULARY Smooth Move-tea drink one glass daily    ? Prucalopride Succinate (MOTEGRITY) 2 MG TABS Take 1 tablet (2 mg total) by mouth daily. 30 tablet 2  ? UNABLE TO FIND Med Name: Pre & post biotic take daily    ? ?No current facility-administered medications on file prior to visit.  ? ?Past Medical History:  ?Diagnosis Date  ? Allergy   ? Blood transfusion without reported diagnosis   ? GERD (gastroesophageal reflux disease)   ? Internal hemorrhoids   ? ?Past Surgical History:  ?Procedure Laterality Date  ? HEMORRHOID SURGERY    ? ? ?Allergies  ?Allergen Reactions  ? Penicillins   ?  REACTION: rash  ? ? ?  Family History  ?Problem Relation Age of Onset  ? Heart disease Mother   ? Colon cancer Neg Hx   ? Colon polyps Neg Hx   ? Esophageal cancer Neg Hx   ? Rectal cancer Neg Hx   ? Stomach cancer Neg Hx   ? ? ?Social History  ? ?Socioeconomic History  ? Marital status: Married  ?  Spouse name: Not on file  ? Number of children: Not on file  ? Years of education: Not on file  ? Highest education level: Not on file  ?Occupational  History  ?  Employer: [employer]  ?Tobacco Use  ? Smoking status: Never  ? Smokeless tobacco: Never  ?Vaping Use  ? Vaping Use: Never used  ?Substance and Sexual Activity  ? Alcohol use: Yes  ?  Comment: rare occ  ? Drug use: Never  ? Sexual activity: Not on file  ?Other Topics Concern  ? Not on file  ?Social History Narrative  ? Not on file  ? ?Social Determinants of Health  ? ?Financial Resource Strain: Not on file  ?Food Insecurity: Not on file  ?Transportation Needs: Not on file  ?Physical Activity: Not on file  ?Stress: Not on file  ?Social Connections: Not on file  ? ?Vitals:  ? 07/17/21 1400  ?BP: 120/70  ?Pulse: 84  ?Resp: 16  ?Temp: 98.4 ?F (36.9 ?C)  ?SpO2: 99%  ? ?Body mass index is 25.89 kg/m?. ? ?Wt Readings from Last 3 Encounters:  ?07/17/21 170 lb 4 oz (77.2 kg)  ?10/30/20 163 lb 12.8 oz (74.3 kg)  ?10/17/20 164 lb 8 oz (74.6 kg)  ? ?Physical Exam ?Vitals and nursing note reviewed.  ?Constitutional:   ?   General: She is not in acute distress. ?   Appearance: She is well-developed.  ?HENT:  ?   Head: Normocephalic and atraumatic.  ?   Right Ear: Hearing, tympanic membrane, ear canal and external ear normal.  ?   Left Ear: Hearing, tympanic membrane, ear canal and external ear normal.  ?   Mouth/Throat:  ?   Mouth: Mucous membranes are moist.  ?   Pharynx: Oropharynx is clear. Uvula midline.  ?Eyes:  ?   Extraocular Movements: Extraocular movements intact.  ?   Conjunctiva/sclera: Conjunctivae normal.  ?   Pupils: Pupils are equal, round, and reactive to light.  ?Neck:  ?   Thyroid: No thyromegaly.  ?   Trachea: No tracheal deviation.  ?Cardiovascular:  ?   Rate and Rhythm: Normal rate and regular rhythm.  ?   Pulses:     ?     Dorsalis pedis pulses are 2+ on the right side and 2+ on the left side.  ?   Heart sounds: No murmur heard. ?Pulmonary:  ?   Effort: Pulmonary effort is normal. No respiratory distress.  ?   Breath sounds: Normal breath sounds.  ?Abdominal:  ?   Palpations: Abdomen is soft.  There is no hepatomegaly or mass.  ?   Tenderness: There is no abdominal tenderness.  ?Genitourinary: ?   Comments: Deferred to gyn. ?Musculoskeletal:  ?   Comments: No major deformity or signs of synovitis appreciated.  ?Lymphadenopathy:  ?   Cervical: No cervical adenopathy.  ?   Upper Body:  ?   Right upper body: No supraclavicular adenopathy.  ?   Left upper body: No supraclavicular adenopathy.  ?Skin: ?   General: Skin is warm.  ?   Findings: No erythema or rash.  ?  Neurological:  ?   General: No focal deficit present.  ?   Mental Status: She is alert and oriented to person, place, and time.  ?   Cranial Nerves: No cranial nerve deficit.  ?   Coordination: Coordination normal.  ?   Gait: Gait normal.  ?   Deep Tendon Reflexes:  ?   Reflex Scores: ?     Bicep reflexes are 2+ on the right side and 2+ on the left side. ?     Patellar reflexes are 2+ on the right side and 2+ on the left side. ?Psychiatric:  ?   Comments: Well groomed, good eye contact.  ? ?ASSESSMENT AND PLAN: ? ?Ms. Tamara Lozano was here today annual physical examination. ? ?Orders Placed This Encounter  ?Procedures  ? Mammogram Digital Screening  ? Comprehensive metabolic panel  ? Lipid panel  ? TSH  ? Hepatitis C antibody screen  ? ?Lab Results  ?Component Value Date  ? TSH 1.43 07/17/2021  ? ?Lab Results  ?Component Value Date  ? CHOL 166 07/17/2021  ? HDL 66.20 07/17/2021  ? [place] 85 07/17/2021  ? TRIG 72.0 07/17/2021  ? CHOLHDL 3 07/17/2021  ? ?Lab Results  ?Component Value Date  ? ALT 21 07/17/2021  ? AST 21 07/17/2021  ? ALKPHOS 48 07/17/2021  ? BILITOT 0.3 07/17/2021  ? ?Lab Results  ?Component Value Date  ? CREATININE 0.78 07/17/2021  ? BUN 18 07/17/2021  ? NA 137 07/17/2021  ? K 4.3 07/17/2021  ? CL 103 07/17/2021  ? CO2 28 07/17/2021  ? ?Routine general medical examination at a health care facility ?We discussed the importance of regular physical activity and healthy diet for prevention of chronic illness and/or  complications. ?Preventive guidelines reviewed. ?Vaccination: She prefers to hold on shingrix vaccine. ?Ca++ and vit D supplementation to continue. ?She is due for pap smear 07/2022, placed order for mammogram. ?Continue follow

## 2021-07-17 ENCOUNTER — Encounter: Payer: Self-pay | Admitting: Family Medicine

## 2021-07-17 ENCOUNTER — Ambulatory Visit (INDEPENDENT_AMBULATORY_CARE_PROVIDER_SITE_OTHER): Payer: 59 | Admitting: Family Medicine

## 2021-07-17 VITALS — BP 120/70 | HR 84 | Temp 98.4°F | Resp 16 | Ht 68.0 in | Wt 170.2 lb

## 2021-07-17 DIAGNOSIS — Z13220 Encounter for screening for lipoid disorders: Secondary | ICD-10-CM | POA: Diagnosis not present

## 2021-07-17 DIAGNOSIS — Z1329 Encounter for screening for other suspected endocrine disorder: Secondary | ICD-10-CM

## 2021-07-17 DIAGNOSIS — Z130 Encounter for screening for diseases of the blood and blood-forming organs and certain disorders involving the immune mechanism: Secondary | ICD-10-CM | POA: Diagnosis not present

## 2021-07-17 DIAGNOSIS — Z13228 Encounter for screening for other metabolic disorders: Secondary | ICD-10-CM

## 2021-07-17 DIAGNOSIS — Z0000 Encounter for general adult medical examination without abnormal findings: Secondary | ICD-10-CM

## 2021-07-17 DIAGNOSIS — Z1239 Encounter for other screening for malignant neoplasm of breast: Secondary | ICD-10-CM

## 2021-07-17 DIAGNOSIS — Z1159 Encounter for screening for other viral diseases: Secondary | ICD-10-CM

## 2021-07-17 DIAGNOSIS — K5900 Constipation, unspecified: Secondary | ICD-10-CM

## 2021-07-17 LAB — COMPREHENSIVE METABOLIC PANEL
ALT: 21 U/L (ref 0–35)
AST: 21 U/L (ref 0–37)
Albumin: 4.3 g/dL (ref 3.5–5.2)
Alkaline Phosphatase: 48 U/L (ref 39–117)
BUN: 18 mg/dL (ref 6–23)
CO2: 28 mEq/L (ref 19–32)
Calcium: 9.4 mg/dL (ref 8.4–10.5)
Chloride: 103 mEq/L (ref 96–112)
Creatinine, Ser: 0.78 mg/dL (ref 0.40–1.20)
GFR: 87.82 mL/min (ref >60.00)
Glucose, Bld: 91 mg/dL (ref 70–99)
Potassium: 4.3 mEq/L (ref 3.5–5.1)
Sodium: 137 mEq/L (ref 135–145)
Total Bilirubin: 0.3 mg/dL (ref 0.2–1.2)
Total Protein: 7.1 g/dL (ref 6.0–8.3)

## 2021-07-17 LAB — LIPID PANEL
Cholesterol: 166 mg/dL (ref 0–200)
HDL: 66.2 mg/dL (ref >39.00)
LDL Cholesterol: 85 mg/dL (ref 0–99)
NonHDL: 99.66
Total CHOL/HDL Ratio: 3
Triglycerides: 72 mg/dL (ref 0.0–149.0)
VLDL: 14.4 mg/dL (ref 0.0–40.0)

## 2021-07-17 LAB — TSH: TSH: 1.43 u[IU]/mL (ref 0.35–5.50)

## 2021-07-17 NOTE — Patient Instructions (Addendum)
A few things to remember from today's visit: ? ?Routine general medical examination at a health care facility ? ?Encounter for HCV screening test for low risk patient - Plan: Hepatitis C antibody screen ? ?Constipation, unspecified constipation type - Plan: TSH ? ?Encounter for screening for malignant neoplasm of breast, unspecified screening modality - Plan: Mammogram Digital Screening ? ?Screening for lipoid disorders - Plan: Lipid panel ? ?Screening for endocrine, metabolic and immunity disorder - Plan: Comprehensive metabolic panel ? ?If you need refills please call your pharmacy. ?Do not use My Chart to request refills or for acute issues that need immediate attention. ?  ?Please be sure medication list is accurate. ?If a new problem present, please set up appointment sooner than planned today. ? ? ? ? ? ? ? ?Health Maintenance, Female ?Adopting a healthy lifestyle and getting preventive care are important in promoting health and wellness. Ask your health care provider about: ?The right schedule for you to have regular tests and exams. ?Things you can do on your own to prevent diseases and keep yourself healthy. ?What should I know about diet, weight, and exercise? ?Eat a healthy diet ? ?Eat a diet that includes plenty of vegetables, fruits, low-fat dairy products, and lean protein. ?Do not eat a lot of foods that are high in solid fats, added sugars, or sodium. ?Maintain a healthy weight ?Body mass index (BMI) is used to identify weight problems. It estimates body fat based on height and weight. Your health care provider can help determine your BMI and help you achieve or maintain a healthy weight. ?Get regular exercise ?Get regular exercise. This is one of the most important things you can do for your health. Most adults should: ?Exercise for at least 150 minutes each week. The exercise should increase your heart rate and make you sweat (moderate-intensity exercise). ?Do strengthening exercises at least twice  a week. This is in addition to the moderate-intensity exercise. ?Spend less time sitting. Even light physical activity can be beneficial. ?Watch cholesterol and blood lipids ?Have your blood tested for lipids and cholesterol at 52 years of age, then have this test every 5 years. ?Have your cholesterol levels checked more often if: ?Your lipid or cholesterol levels are high. ?You are older than 52 years of age. ?You are at high risk for heart disease. ?What should I know about cancer screening? ?Depending on your health history and family history, you may need to have cancer screening at various ages. This may include screening for: ?Breast cancer. ?Cervical cancer. ?Colorectal cancer. ?Skin cancer. ?Lung cancer. ?What should I know about heart disease, diabetes, and high blood pressure? ?Blood pressure and heart disease ?High blood pressure causes heart disease and increases the risk of stroke. This is more likely to develop in people who have high blood pressure readings or are overweight. ?Have your blood pressure checked: ?Every 3-5 years if you are 30-1 years of age. ?Every year if you are 75 years old or older. ?Diabetes ?Have regular diabetes screenings. This checks your fasting blood sugar level. Have the screening done: ?Once every three years after age 45 if you are at a normal weight and have a low risk for diabetes. ?More often and at a younger age if you are overweight or have a high risk for diabetes. ?What should I know about preventing infection? ?Hepatitis B ?If you have a higher risk for hepatitis B, you should be screened for this virus. Talk with your health care provider to find out if  you are at risk for hepatitis B infection. ?Hepatitis C ?Testing is recommended for: ?Everyone born from [birth year] through 1966. ?Anyone with known risk factors for hepatitis C. ?Sexually transmitted infections (STIs) ?Get screened for STIs, including gonorrhea and chlamydia, if: ?You are sexually active and are  younger than 52 years of age. ?You are older than 52 years of age and your health care provider tells you that you are at risk for this type of infection. ?Your sexual activity has changed since you were last screened, and you are at increased risk for chlamydia or gonorrhea. Ask your health care provider if you are at risk. ?Ask your health care provider about whether you are at high risk for HIV. Your health care provider may recommend a prescription medicine to help prevent HIV infection. If you choose to take medicine to prevent HIV, you should first get tested for HIV. You should then be tested every 3 months for as long as you are taking the medicine. ?Pregnancy ?If you are about to stop having your period (premenopausal) and you may become pregnant, seek counseling before you get pregnant. ?Take 400 to 800 micrograms (mcg) of folic acid every day if you become pregnant. ?Ask for birth control (contraception) if you want to prevent pregnancy. ?Osteoporosis and menopause ?Osteoporosis is a disease in which the bones lose minerals and strength with aging. This can result in bone fractures. If you are 45 years old or older, or if you are at risk for osteoporosis and fractures, ask your health care provider if you should: ?Be screened for bone loss. ?Take a calcium or vitamin D supplement to lower your risk of fractures. ?Be given hormone replacement therapy (HRT) to treat symptoms of menopause. ?Follow these instructions at home: ?Alcohol use ?Do not drink alcohol if: ?Your health care provider tells you not to drink. ?You are pregnant, may be pregnant, or are planning to become pregnant. ?If you drink alcohol: ?Limit how much you have to: ?0-1 drink a day. ?Know how much alcohol is in your drink. In the [place] one drink equals one 12 oz bottle of beer (355 mL), one 5 oz glass of wine (148 mL), or one 1? oz glass of hard liquor (44 mL). ?Lifestyle ?Do not use any products that contain nicotine or tobacco. These  products include cigarettes, chewing tobacco, and vaping devices, such as e-cigarettes. If you need help quitting, ask your health care provider. ?Do not use street drugs. ?Do not share needles. ?Ask your health care provider for help if you need support or information about quitting drugs. ?General instructions ?Schedule regular health, dental, and eye exams. ?Stay current with your vaccines. ?Tell your health care provider if: ?You often feel depressed. ?You have ever been abused or do not feel safe at home. ?Summary ?Adopting a healthy lifestyle and getting preventive care are important in promoting health and wellness. ?Follow your health care provider's instructions about healthy diet, exercising, and getting tested or screened for diseases. ?Follow your health care provider's instructions on monitoring your cholesterol and blood pressure. ?This information is not intended to replace advice given to you by your health care provider. Make sure you discuss any questions you have with your health care provider. ?Document Revised: 08/04/2020 Document Reviewed: 08/04/2020 ?Elsevier Patient Education ? 2023 [employer] Inc[employer] ? ?

## 2021-07-20 LAB — HEPATITIS C ANTIBODY
Hepatitis C Ab: NONREACTIVE
SIGNAL TO CUT-OFF: 0.11 (ref <1.00)

## 2021-07-28 ENCOUNTER — Ambulatory Visit
Admission: RE | Admit: 2021-07-28 | Discharge: 2021-07-28 | Disposition: A | Discharge status: Home / Self Care | Payer: 59 | Source: Ambulatory Visit | Attending: Family Medicine | Admitting: Family Medicine

## 2021-07-28 DIAGNOSIS — Z1239 Encounter for other screening for malignant neoplasm of breast: Secondary | ICD-10-CM

## 2021-07-28 DIAGNOSIS — Z1231 Encounter for screening mammogram for malignant neoplasm of breast: Secondary | ICD-10-CM | POA: Diagnosis not present

## 2021-08-10 ENCOUNTER — Other Ambulatory Visit (HOSPITAL_COMMUNITY): Payer: Self-pay

## 2021-08-13 ENCOUNTER — Other Ambulatory Visit (HOSPITAL_COMMUNITY): Payer: Self-pay

## 2021-08-13 ENCOUNTER — Other Ambulatory Visit: Payer: Self-pay | Admitting: Family Medicine

## 2021-08-14 ENCOUNTER — Other Ambulatory Visit (HOSPITAL_COMMUNITY): Payer: Self-pay

## 2021-09-14 ENCOUNTER — Encounter: Payer: Self-pay | Admitting: Gastroenterology

## 2021-09-14 ENCOUNTER — Other Ambulatory Visit (HOSPITAL_COMMUNITY): Payer: Self-pay

## 2021-09-15 ENCOUNTER — Encounter: Payer: Self-pay | Admitting: Plastic Surgery

## 2021-09-15 ENCOUNTER — Ambulatory Visit (INDEPENDENT_AMBULATORY_CARE_PROVIDER_SITE_OTHER): Payer: Self-pay | Admitting: Plastic Surgery

## 2021-09-15 VITALS — BP 117/78 | HR 77 | Ht 68.0 in | Wt 159.0 lb

## 2021-09-15 DIAGNOSIS — Z719 Counseling, unspecified: Secondary | ICD-10-CM

## 2021-09-15 NOTE — Progress Notes (Signed)

## 2021-09-21 ENCOUNTER — Ambulatory Visit: Payer: Self-pay | Admitting: General Surgery

## 2021-09-21 DIAGNOSIS — K642 Third degree hemorrhoids: Secondary | ICD-10-CM | POA: Diagnosis not present

## 2021-10-09 ENCOUNTER — Other Ambulatory Visit: Payer: Self-pay | Admitting: Gastroenterology

## 2021-10-09 ENCOUNTER — Other Ambulatory Visit (HOSPITAL_COMMUNITY): Payer: Self-pay

## 2021-10-09 MED ORDER — MOTEGRITY 2 MG PO TABS
1.0000 | ORAL_TABLET | Freq: Every day | ORAL | 2 refills | Status: DC
  Filled 2021-10-09: qty 30, 30d supply, fill #0
  Filled 2021-11-04: qty 30, 30d supply, fill #1
  Filled 2021-12-08: qty 30, 30d supply, fill #2

## 2021-11-05 ENCOUNTER — Other Ambulatory Visit (HOSPITAL_COMMUNITY): Payer: Self-pay

## 2021-11-09 ENCOUNTER — Encounter: Payer: Self-pay | Admitting: Gastroenterology

## 2021-12-08 ENCOUNTER — Other Ambulatory Visit (HOSPITAL_COMMUNITY): Payer: Self-pay

## 2022-01-10 ENCOUNTER — Other Ambulatory Visit: Payer: Self-pay | Admitting: Gastroenterology

## 2022-01-11 ENCOUNTER — Other Ambulatory Visit (HOSPITAL_COMMUNITY): Payer: Self-pay

## 2022-01-12 ENCOUNTER — Other Ambulatory Visit (HOSPITAL_COMMUNITY): Payer: Self-pay

## 2022-02-24 ENCOUNTER — Other Ambulatory Visit (HOSPITAL_COMMUNITY): Payer: Self-pay

## 2022-07-16 NOTE — Progress Notes (Unsigned)
HPI: [name] is a 52 y.o. female, who is here today for her routine physical.  Last CPE: 07/17/21  Regular exercise 3 or more time per week: *** Following a healthy diet: ***  Chronic medical problems: ***  Immunization History  Administered Date(s) Administered   Influenza,inj,Quad PF,6+ Mos 01/07/2021   Influenza-Unspecified 12/27/2021   Td 03/29/2006   Tdap 08/25/2018   Health Maintenance  Topic Date Due   COVID-19 Vaccine (1) Never done   Zoster Vaccines- Shingrix (1 of 2) Never done   COLONOSCOPY (Pts 45-77yrs Insurance coverage will need to be confirmed)  10/06/2021   PAP SMEAR-Modifier  08/16/2022   INFLUENZA VACCINE  10/28/2022   MAMMOGRAM  07/29/2023   DTaP/Tdap/Td (3 - Td or Tdap) 08/24/2028   Hepatitis C Screening  Completed   HIV Screening  Completed   HPV VACCINES  Aged Out    She has *** concerns today.  Review of Systems  Current Outpatient Medications on File Prior to Visit  Medication Sig Dispense Refill   acetaminophen (TYLENOL) 500 MG tablet Take 2 tablets (1,000 mg total) by mouth every 6 (six) hours as needed. 30 tablet 0   loratadine (CLARITIN) 10 MG tablet Take 10 mg by mouth daily.     MAGNESIUM PO Take by mouth daily. Also has Melatonin in the magnesium     Multiple Vitamin (MULTIVITAMIN) tablet Take 1 tablet by mouth daily.     NON FORMULARY Smooth Move-tea drink one glass daily     Prucalopride Succinate (MOTEGRITY) 2 MG TABS Take 1 tablet (2 mg total) by mouth daily. 30 tablet 2   UNABLE TO FIND Med Name: Pre & post biotic take daily     No current facility-administered medications on file prior to visit.    Past Medical History:  Diagnosis Date   Allergy    Blood transfusion without reported diagnosis    GERD (gastroesophageal reflux disease)    Internal hemorrhoids     Past Surgical History:  Procedure Laterality Date   HEMORRHOID SURGERY      Allergies  Allergen Reactions   Penicillins     REACTION: rash     Family History  Problem Relation Age of Onset   Heart disease Mother    Colon cancer Neg Hx    Colon polyps Neg Hx    Esophageal cancer Neg Hx    Rectal cancer Neg Hx    Stomach cancer Neg Hx     Social History   Socioeconomic History   Marital status: Married    Spouse name: Not on file   Number of children: Not on file   Years of education: Not on file   Highest education level: Not on file  Occupational History    Employer: [employer]  Tobacco Use   Smoking status: Never   Smokeless tobacco: Never  Vaping Use   Vaping Use: Never used  Substance and Sexual Activity   Alcohol use: Yes    Comment: rare occ   Drug use: Never   Sexual activity: Not on file  Other Topics Concern   Not on file  Social History Narrative   Not on file   Social Determinants of Health   Financial Resource Strain: Not on file  Food Insecurity: Not on file  Transportation Needs: Not on file  Physical Activity: Not on file  Stress: Not on file  Social Connections: Not on file    There were no vitals filed for this visit.  There is no height or weight on file to calculate BMI.  Wt Readings from Last 3 Encounters:  09/15/21 159 lb (72.1 kg)  07/17/21 170 lb 4 oz (77.2 kg)  10/30/20 163 lb 12.8 oz (74.3 kg)    Physical Exam Vitals and nursing note reviewed.  Constitutional:      General: She is not in acute distress.    Appearance: She is well-developed.  HENT:     Head: Normocephalic and atraumatic.     Right Ear: Hearing, tympanic membrane, ear canal and external ear normal.     Left Ear: Hearing, tympanic membrane, ear canal and external ear normal.     Mouth/Throat:     Mouth: Mucous membranes are moist.     Pharynx: Oropharynx is clear. Uvula midline.  Eyes:     Extraocular Movements: Extraocular movements intact.     Conjunctiva/sclera: Conjunctivae normal.     Pupils: Pupils are equal, round, and reactive to light.  Neck:     Thyroid: No thyromegaly.     Trachea:  No tracheal deviation.  Cardiovascular:     Rate and Rhythm: Normal rate and regular rhythm.     Pulses:          Dorsalis pedis pulses are 2+ on the right side and 2+ on the left side.       Posterior tibial pulses are 2+ on the right side and 2+ on the left side.     Heart sounds: No murmur heard. Pulmonary:     Effort: Pulmonary effort is normal. No respiratory distress.     Breath sounds: Normal breath sounds.  Abdominal:     Palpations: Abdomen is soft. There is no hepatomegaly or mass.     Tenderness: There is no abdominal tenderness.  Genitourinary:    Comments: Deferred to gyn. Musculoskeletal:     Comments: No major deformity or signs of synovitis appreciated.  Lymphadenopathy:     Cervical: No cervical adenopathy.     Upper Body:     Right upper body: No supraclavicular adenopathy.     Left upper body: No supraclavicular adenopathy.  Skin:    General: Skin is warm.     Findings: No erythema or rash.  Neurological:     General: No focal deficit present.     Mental Status: She is alert and oriented to person, place, and time.     Cranial Nerves: No cranial nerve deficit.     Coordination: Coordination normal.     Gait: Gait normal.     Deep Tendon Reflexes:     Reflex Scores:      Bicep reflexes are 2+ on the right side and 2+ on the left side.      Patellar reflexes are 2+ on the right side and 2+ on the left side. Psychiatric:     Comments: Well groomed, good eye contact.     ASSESSMENT AND PLAN: Ms. Tamara Lozano was here today annual physical examination.  No orders of the defined types were placed in this encounter.   There are no diagnoses linked to this encounter.  There are no diagnoses linked to this encounter.  No follow-ups on file.  Loribeth Katich G. [place], MD  [facility] [place] office.

## 2022-07-19 ENCOUNTER — Ambulatory Visit (INDEPENDENT_AMBULATORY_CARE_PROVIDER_SITE_OTHER): Payer: Commercial Managed Care - PPO | Admitting: Family Medicine

## 2022-07-19 ENCOUNTER — Encounter: Payer: Self-pay | Admitting: Family Medicine

## 2022-07-19 VITALS — BP 100/60 | HR 94 | Temp 98.6°F | Resp 12 | Ht 68.0 in | Wt 176.0 lb

## 2022-07-19 DIAGNOSIS — Z130 Encounter for screening for diseases of the blood and blood-forming organs and certain disorders involving the immune mechanism: Secondary | ICD-10-CM | POA: Diagnosis not present

## 2022-07-19 DIAGNOSIS — Z13228 Encounter for screening for other metabolic disorders: Secondary | ICD-10-CM | POA: Diagnosis not present

## 2022-07-19 DIAGNOSIS — Z13220 Encounter for screening for lipoid disorders: Secondary | ICD-10-CM

## 2022-07-19 DIAGNOSIS — Z1329 Encounter for screening for other suspected endocrine disorder: Secondary | ICD-10-CM | POA: Diagnosis not present

## 2022-07-19 DIAGNOSIS — Z1231 Encounter for screening mammogram for malignant neoplasm of breast: Secondary | ICD-10-CM

## 2022-07-19 DIAGNOSIS — Z0000 Encounter for general adult medical examination without abnormal findings: Secondary | ICD-10-CM

## 2022-07-19 DIAGNOSIS — Z1211 Encounter for screening for malignant neoplasm of colon: Secondary | ICD-10-CM

## 2022-07-19 LAB — BASIC METABOLIC PANEL
BUN: 16 mg/dL (ref 6–23)
CO2: 24 mEq/L (ref 19–32)
Calcium: 9.3 mg/dL (ref 8.4–10.5)
Chloride: 103 mEq/L (ref 96–112)
Creatinine, Ser: 0.84 mg/dL (ref 0.40–1.20)
GFR: 79.78 mL/min (ref >60.00)
Glucose, Bld: 83 mg/dL (ref 70–99)
Potassium: 4.5 mEq/L (ref 3.5–5.1)
Sodium: 134 mEq/L — ABNORMAL LOW (ref 135–145)

## 2022-07-19 NOTE — Assessment & Plan Note (Addendum)
We discussed the importance of regular physical activity and healthy diet for prevention of chronic illness and/or complications. Preventive guidelines reviewed. Vaccination: She prefers to hold on shingrix vaccine.Rest of vaccines up to date. Ca++ and vit D supplementation to continue. She is due for pap smear, she is going to arrange appt with her gynecologist. Next CPE in a year.

## 2022-07-19 NOTE — Patient Instructions (Addendum)
A few things to remember from today's visit:  Routine general medical examination at a health care facility  Screening for endocrine, metabolic and immunity disorder - Plan: Basic metabolic panel  If you need refills for medications you take chronically, please call your pharmacy. Do not use My Chart to request refills or for acute issues that need immediate attention. If you send a my chart message, it may take a few days to be addressed, specially if I am not in the office.  Please be sure medication list is accurate. If a new problem present, please set up appointment sooner than planned today.  Health Maintenance, Female Adopting a healthy lifestyle and getting preventive care are important in promoting health and wellness. Ask your health care provider about: The right schedule for you to have regular tests and exams. Things you can do on your own to prevent diseases and keep yourself healthy. What should I know about diet, weight, and exercise? Eat a healthy diet  Eat a diet that includes plenty of vegetables, fruits, low-fat dairy products, and lean protein. Do not eat a lot of foods that are high in solid fats, added sugars, or sodium. Maintain a healthy weight Body mass index (BMI) is used to identify weight problems. It estimates body fat based on height and weight. Your health care provider can help determine your BMI and help you achieve or maintain a healthy weight. Get regular exercise Get regular exercise. This is one of the most important things you can do for your health. Most adults should: Exercise for at least 150 minutes each week. The exercise should increase your heart rate and make you sweat (moderate-intensity exercise). Do strengthening exercises at least twice a week. This is in addition to the moderate-intensity exercise. Spend less time sitting. Even light physical activity can be beneficial. Watch cholesterol and blood lipids Have your blood tested for  lipids and cholesterol at 53 years of age, then have this test every 5 years. Have your cholesterol levels checked more often if: Your lipid or cholesterol levels are high. You are older than 53 years of age. You are at high risk for heart disease. What should I know about cancer screening? Depending on your health history and family history, you may need to have cancer screening at various ages. This may include screening for: Breast cancer. Cervical cancer. Colorectal cancer. Skin cancer. Lung cancer. What should I know about heart disease, diabetes, and high blood pressure? Blood pressure and heart disease High blood pressure causes heart disease and increases the risk of stroke. This is more likely to develop in people who have high blood pressure readings or are overweight. Have your blood pressure checked: Every 3-5 years if you are 62-4 years of age. Every year if you are 40 years old or older. Diabetes Have regular diabetes screenings. This checks your fasting blood sugar level. Have the screening done: Once every three years after age 60 if you are at a normal weight and have a low risk for diabetes. More often and at a younger age if you are overweight or have a high risk for diabetes. What should I know about preventing infection? Hepatitis B If you have a higher risk for hepatitis B, you should be screened for this virus. Talk with your health care provider to find out if you are at risk for hepatitis B infection. Hepatitis C Testing is recommended for: Everyone born from [birth year] through 1966. Anyone with known risk factors for hepatitis C.  Sexually transmitted infections (STIs) Get screened for STIs, including gonorrhea and chlamydia, if: You are sexually active and are younger than 53 years of age. You are older than 53 years of age and your health care provider tells you that you are at risk for this type of infection. Your sexual activity has changed since you were last  screened, and you are at increased risk for chlamydia or gonorrhea. Ask your health care provider if you are at risk. Ask your health care provider about whether you are at high risk for HIV. Your health care provider may recommend a prescription medicine to help prevent HIV infection. If you choose to take medicine to prevent HIV, you should first get tested for HIV. You should then be tested every 3 months for as long as you are taking the medicine. Pregnancy If you are about to stop having your period (premenopausal) and you may become pregnant, seek counseling before you get pregnant. Take 400 to 800 micrograms (mcg) of folic acid every day if you become pregnant. Ask for birth control (contraception) if you want to prevent pregnancy. Osteoporosis and menopause Osteoporosis is a disease in which the bones lose minerals and strength with aging. This can result in bone fractures. If you are 47 years old or older, or if you are at risk for osteoporosis and fractures, ask your health care provider if you should: Be screened for bone loss. Take a calcium or vitamin D supplement to lower your risk of fractures. Be given hormone replacement therapy (HRT) to treat symptoms of menopause. Follow these instructions at home: Alcohol use Do not drink alcohol if: Your health care provider tells you not to drink. You are pregnant, may be pregnant, or are planning to become pregnant. If you drink alcohol: Limit how much you have to: 0-1 drink a day. Know how much alcohol is in your drink. In the [place] one drink equals one 12 oz bottle of beer (355 mL), one 5 oz glass of wine (148 mL), or one 1 oz glass of hard liquor (44 mL). Lifestyle Do not use any products that contain nicotine or tobacco. These products include cigarettes, chewing tobacco, and vaping devices, such as e-cigarettes. If you need help quitting, ask your health care provider. Do not use street drugs. Do not share needles. Ask your health  care provider for help if you need support or information about quitting drugs. General instructions Schedule regular health, dental, and eye exams. Stay current with your vaccines. Tell your health care provider if: You often feel depressed. You have ever been abused or do not feel safe at home. Summary Adopting a healthy lifestyle and getting preventive care are important in promoting health and wellness. Follow your health care provider's instructions about healthy diet, exercising, and getting tested or screened for diseases. Follow your health care provider's instructions on monitoring your cholesterol and blood pressure. This information is not intended to replace advice given to you by your health care provider. Make sure you discuss any questions you have with your health care provider. Document Revised: 08/04/2020 Document Reviewed: 08/04/2020 Elsevier Patient Education  2023 [employer].

## 2022-08-02 ENCOUNTER — Ambulatory Visit (INDEPENDENT_AMBULATORY_CARE_PROVIDER_SITE_OTHER): Payer: Self-pay | Admitting: Plastic Surgery

## 2022-08-02 DIAGNOSIS — Z719 Counseling, unspecified: Secondary | ICD-10-CM

## 2022-08-02 NOTE — Progress Notes (Signed)

## 2022-10-25 ENCOUNTER — Other Ambulatory Visit: Payer: Self-pay | Admitting: Oncology

## 2022-10-25 DIAGNOSIS — Z006 Encounter for examination for normal comparison and control in clinical research program: Secondary | ICD-10-CM

## 2023-01-12 ENCOUNTER — Other Ambulatory Visit: Payer: Self-pay

## 2023-01-12 ENCOUNTER — Other Ambulatory Visit (HOSPITAL_COMMUNITY)
Admission: RE | Admit: 2023-01-12 | Discharge: 2023-01-12 | Disposition: A | Discharge status: Home / Self Care | Payer: Commercial Managed Care - PPO | Source: Ambulatory Visit | Attending: Oncology | Admitting: Oncology

## 2023-01-12 DIAGNOSIS — Z01419 Encounter for gynecological examination (general) (routine) without abnormal findings: Secondary | ICD-10-CM

## 2023-01-12 DIAGNOSIS — Z006 Encounter for examination for normal comparison and control in clinical research program: Secondary | ICD-10-CM | POA: Insufficient documentation

## 2023-01-12 NOTE — Addendum Note (Signed)
Addended by: Weyman Croon E on: 01/12/2023 11:31 AM   Modules accepted: Orders

## 2023-01-14 ENCOUNTER — Ambulatory Visit: Payer: Commercial Managed Care - PPO | Admitting: Family Medicine

## 2023-01-20 LAB — HELIX MOLECULAR SCREEN: Genetic Analysis Overall Interpretation: NEGATIVE

## 2023-02-11 ENCOUNTER — Ambulatory Visit
Admission: RE | Admit: 2023-02-11 | Discharge: 2023-02-11 | Disposition: A | Discharge status: Home / Self Care | Payer: Commercial Managed Care - PPO | Source: Ambulatory Visit | Attending: Family Medicine | Admitting: Family Medicine

## 2023-02-11 DIAGNOSIS — Z1231 Encounter for screening mammogram for malignant neoplasm of breast: Secondary | ICD-10-CM

## 2023-03-17 ENCOUNTER — Encounter: Payer: Commercial Managed Care - PPO | Admitting: Obstetrics and Gynecology

## 2023-04-26 DIAGNOSIS — N921 Excessive and frequent menstruation with irregular cycle: Secondary | ICD-10-CM

## 2023-05-13 ENCOUNTER — Ambulatory Visit (INDEPENDENT_AMBULATORY_CARE_PROVIDER_SITE_OTHER): Payer: Commercial Managed Care - PPO | Admitting: Obstetrics and Gynecology

## 2023-05-13 ENCOUNTER — Other Ambulatory Visit (HOSPITAL_COMMUNITY)
Admission: RE | Admit: 2023-05-13 | Discharge: 2023-05-13 | Disposition: A | Discharge status: Home / Self Care | Payer: Commercial Managed Care - PPO | Source: Ambulatory Visit | Attending: Obstetrics and Gynecology | Admitting: Obstetrics and Gynecology

## 2023-05-13 ENCOUNTER — Encounter: Payer: Self-pay | Admitting: Obstetrics and Gynecology

## 2023-05-13 VITALS — BP 116/80 | HR 88 | Ht 66.75 in | Wt 144.0 lb

## 2023-05-13 DIAGNOSIS — E785 Hyperlipidemia, unspecified: Secondary | ICD-10-CM | POA: Diagnosis not present

## 2023-05-13 DIAGNOSIS — Z01419 Encounter for gynecological examination (general) (routine) without abnormal findings: Secondary | ICD-10-CM

## 2023-05-13 DIAGNOSIS — N841 Polyp of cervix uteri: Secondary | ICD-10-CM | POA: Diagnosis not present

## 2023-05-13 DIAGNOSIS — Z1331 Encounter for screening for depression: Secondary | ICD-10-CM

## 2023-05-13 DIAGNOSIS — N938 Other specified abnormal uterine and vaginal bleeding: Secondary | ICD-10-CM

## 2023-05-13 DIAGNOSIS — Z0000 Encounter for general adult medical examination without abnormal findings: Secondary | ICD-10-CM | POA: Diagnosis not present

## 2023-05-13 DIAGNOSIS — N840 Polyp of corpus uteri: Secondary | ICD-10-CM | POA: Diagnosis not present

## 2023-05-13 NOTE — Addendum Note (Signed)
Addended by: Earley Favor on: 05/13/2023 11:29 AM   Modules accepted: Orders

## 2023-05-13 NOTE — Progress Notes (Signed)
53 y.o. y.o. female here for annual exam. Patient's last menstrual period was 04/21/2023 (exact date). Period Duration (Days): 7 Period Pattern: (!) Irregular Menstrual Flow: Moderate Menstrual Control: Maxi pad Dysmenorrhea: None  Can go one month without a period and then sometimes have two periods in a month Bleeds for 7 days. First two days are heavy but not bothered by the bleeding  Pap smear 2019 normal HPV neg Birth control: none Last mammogram: 02/14/23 Last colonoscopy: 10/06/2020 Dxa: not done Body mass index is 22.72 kg/m.     05/13/2023   10:46 AM 07/19/2022    8:34 AM 07/17/2021    2:09 PM  Depression screen PHQ 2/9  Decreased Interest 0 0 0  Down, Depressed, Hopeless 0 0 0  PHQ - 2 Score 0 0 0    Blood pressure 116/80, pulse 88, height 5' 6.75" (1.695 m), weight 144 lb (65.3 kg), last menstrual period 04/21/2023, SpO2 98%.     Component Value Date/Time   DIAGPAP  08/15/2017 0000    NEGATIVE FOR INTRAEPITHELIAL LESIONS OR MALIGNANCY.   ADEQPAP  08/15/2017 0000    Satisfactory for evaluation  endocervical/transformation zone component PRESENT.    GYN HISTORY:    Component Value Date/Time   DIAGPAP  08/15/2017 0000    NEGATIVE FOR INTRAEPITHELIAL LESIONS OR MALIGNANCY.   ADEQPAP  08/15/2017 0000    Satisfactory for evaluation  endocervical/transformation zone component PRESENT.    OB History  Gravida Para Term Preterm AB Living  0 0 0 0 0 0  SAB IAB Ectopic Multiple Live Births  0 0 0 0 0    Past Medical History:  Diagnosis Date   Allergy    Blood transfusion without reported diagnosis    GERD (gastroesophageal reflux disease)    Internal hemorrhoids     Past Surgical History:  Procedure Laterality Date   HEMORRHOID SURGERY      Current Outpatient Medications on File Prior to Visit  Medication Sig Dispense Refill   acetaminophen (TYLENOL) 500 MG tablet Take 2 tablets (1,000 mg total) by mouth every 6 (six) hours as needed. 30 tablet  0   MAGNESIUM PO Take by mouth daily. Also has Melatonin in the magnesium     Multiple Vitamin (MULTIVITAMIN) tablet Take 1 tablet by mouth daily.     No current facility-administered medications on file prior to visit.    Social History   Socioeconomic History   Marital status: Married    Spouse name: Not on file   Number of children: Not on file   Years of education: Not on file   Highest education level: Not on file  Occupational History    Employer: [employer]  Tobacco Use   Smoking status: Never   Smokeless tobacco: Never  Vaping Use   Vaping status: Never Used  Substance and Sexual Activity   Alcohol use: Not Currently   Drug use: Never   Sexual activity: Yes    Partners: Male    Birth control/protection: Other-see comments    Comment: husband vasectomy  Other Topics Concern   Not on file  Social History Narrative   Not on file   Social Drivers of Health   Financial Resource Strain: Not on file  Food Insecurity: Not on file  Transportation Needs: Not on file  Physical Activity: Not on file  Stress: Not on file  Social Connections: Not on file  Intimate Partner Violence: Not on file    Family History  Problem Relation  Age of Onset   Heart disease Mother    Breast cancer Maternal Aunt 48 - 79   Colon cancer Neg Hx    Colon polyps Neg Hx    Esophageal cancer Neg Hx    Rectal cancer Neg Hx    Stomach cancer Neg Hx    BRCA 1/2 Neg Hx      Allergies  Allergen Reactions   Penicillins     REACTION: rash      Patient's last menstrual period was Patient's last menstrual period was 04/21/2023 (exact date)..           Review of Systems Alls systems reviewed and are negative.     Physical Exam Constitutional:      Appearance: Normal appearance.  Genitourinary:     Vulva and urethral meatus normal.     No lesions in the vagina.     Right Labia: No rash, lesions or skin changes.    Left Labia: No lesions, skin changes or rash.       No  vaginal discharge or tenderness.     No vaginal prolapse present.    No vaginal atrophy present.     Right Adnexa: not tender, not palpable and no mass present.    Left Adnexa: not tender, not palpable and no mass present.    No cervical motion tenderness or discharge.        Uterus is not enlarged, tender or irregular.  Breasts:    Right: Normal.     Left: Normal.  HENT:     Head: Normocephalic.  Neck:     Thyroid: No thyroid mass, thyromegaly or thyroid tenderness.  Cardiovascular:     Rate and Rhythm: Normal rate and regular rhythm.     Heart sounds: Normal heart sounds, S1 normal and S2 normal.  Pulmonary:     Effort: Pulmonary effort is normal.     Breath sounds: Normal breath sounds and air entry.  Abdominal:     General: There is no distension.     Palpations: Abdomen is soft. There is no mass.     Tenderness: There is no abdominal tenderness. There is no guarding or rebound.  Musculoskeletal:        General: Normal range of motion.     Cervical back: Full passive range of motion without pain, normal range of motion and neck supple. No tenderness.     Right lower leg: No edema.     Left lower leg: No edema.  Neurological:     Mental Status: She is alert.  Skin:    General: Skin is warm.  Psychiatric:        Mood and Affect: Mood normal.        Behavior: Behavior normal.        Thought Content: Thought content normal.  Vitals and nursing note reviewed. Exam conducted with a chaperone present.      A:         Well Woman GYN exam with DUB                             P:        Pap smear collected today Encouraged annual mammogram screening Colon cancer screening up-to-date DXA not indicated Labs and immunizations to do with PMD Encouraged healthy lifestyle practices Encouraged Vit D and Calcium  Endocervical polyp removed. To notify patient of pathology results Discussed concern for two periods in a  month with precancerous or cancerous cells in  perimenopause v. Menopause. To get hormone panel today and PUS.  May need EMB based on the [place] results.  She voiced understanding  No follow-ups on file.  Earley Favor

## 2023-05-14 LAB — ESTRADIOL: Estradiol: 386 pg/mL — ABNORMAL HIGH

## 2023-05-14 LAB — FOLLICLE STIMULATING HORMONE: FSH: 33.1 m[IU]/mL

## 2023-05-14 LAB — VITAMIN D 25 HYDROXY (VIT D DEFICIENCY, FRACTURES): Vit D, 25-Hydroxy: 68 ng/mL (ref 30–100)

## 2023-05-17 ENCOUNTER — Encounter: Payer: Self-pay | Admitting: Obstetrics and Gynecology

## 2023-05-17 ENCOUNTER — Other Ambulatory Visit: Payer: Self-pay

## 2023-05-17 DIAGNOSIS — N938 Other specified abnormal uterine and vaginal bleeding: Secondary | ICD-10-CM

## 2023-05-17 LAB — CYTOLOGY - PAP
Comment: NEGATIVE
Diagnosis: NEGATIVE
High risk HPV: NEGATIVE

## 2023-05-17 LAB — SURGICAL PATHOLOGY

## 2023-05-31 ENCOUNTER — Ambulatory Visit (INDEPENDENT_AMBULATORY_CARE_PROVIDER_SITE_OTHER): Payer: Commercial Managed Care - PPO | Admitting: Obstetrics and Gynecology

## 2023-05-31 ENCOUNTER — Encounter: Payer: Self-pay | Admitting: Obstetrics and Gynecology

## 2023-05-31 ENCOUNTER — Other Ambulatory Visit (HOSPITAL_COMMUNITY)
Admission: RE | Admit: 2023-05-31 | Discharge: 2023-05-31 | Disposition: A | Discharge status: Home / Self Care | Source: Ambulatory Visit | Attending: Obstetrics and Gynecology | Admitting: Obstetrics and Gynecology

## 2023-05-31 ENCOUNTER — Ambulatory Visit (INDEPENDENT_AMBULATORY_CARE_PROVIDER_SITE_OTHER): Payer: Commercial Managed Care - PPO

## 2023-05-31 VITALS — BP 118/62 | HR 96

## 2023-05-31 DIAGNOSIS — N921 Excessive and frequent menstruation with irregular cycle: Secondary | ICD-10-CM

## 2023-05-31 DIAGNOSIS — D219 Benign neoplasm of connective and other soft tissue, unspecified: Secondary | ICD-10-CM | POA: Diagnosis not present

## 2023-05-31 DIAGNOSIS — N938 Other specified abnormal uterine and vaginal bleeding: Secondary | ICD-10-CM | POA: Diagnosis not present

## 2023-05-31 DIAGNOSIS — N950 Postmenopausal bleeding: Secondary | ICD-10-CM | POA: Insufficient documentation

## 2023-05-31 NOTE — Progress Notes (Signed)
 Patient presents for PUS and EMB.  Patient with DUB, fibroids. Illicit some urinary symptoms no dysmenorrhea. Can have two periods in a month.  Blood pressure 118/62, pulse 96, last menstrual period 04/21/2023, SpO2 99%.  PUS with 9.77cm fibroid uterus Endometrial thickness 18.49mm  Normal ovaries. Left ovary with 3.3cm and 2.1 simple cyst Multiple fibroids 7 measured ranging from 0.93cm to 2.72cm  CX: A 6.7x3.8 polypoid area is seen in the endocervical canal with what appears to be a feeder vessel ?polp   PROCEDURE: EMB Consent obtained for the procedure.  A bivalve speculum was placed in the vagina.  The cervix was grasped with a single tooth tenaculum.  Pipelle was inserted and rotated.  Adequate specimen was obtained and sent to pathology.  All instruments were removed.  Patient tolerated the procedure well.  To notify patient of the results.   DUB, fibroids, endometrial polyp in perimenopausal period Discussed all options such as myosure D&C, ECC with conservative care and repeat US in the future possible EMB in future as well. Discussed polyps can return and have less than 1% risk for cancer. Hormonal options, but will still need to remove the polyp. Also reviewed option with the [facility]. The r/b/a/I of each were discussed with patient.  She would like to proceed with the [facility]   Counseled on postop care and pelvic rest for 10 weeks after the surgery with restricted lifting for 6 weeks after.  Counseled on the benefits of the robotic procedure with faster return to daily activities, improved outcomes, and less risk for complications. She would like to have this scheduled. She has vacation scheduled for [place] in July.   Would be better due to 3-4 month wait for surgery to have after the trip.  Dr. Karma Greaser

## 2023-06-02 ENCOUNTER — Encounter: Payer: Self-pay | Admitting: Obstetrics and Gynecology

## 2023-06-02 LAB — SURGICAL PATHOLOGY

## 2023-06-09 ENCOUNTER — Other Ambulatory Visit: Payer: Commercial Managed Care - PPO | Admitting: Obstetrics and Gynecology

## 2023-06-09 ENCOUNTER — Other Ambulatory Visit: Payer: Commercial Managed Care - PPO

## 2024-02-03 ENCOUNTER — Encounter (HOSPITAL_BASED_OUTPATIENT_CLINIC_OR_DEPARTMENT_OTHER): Payer: Self-pay

## 2024-02-09 ENCOUNTER — Ambulatory Visit (HOSPITAL_BASED_OUTPATIENT_CLINIC_OR_DEPARTMENT_OTHER): Admitting: Obstetrics & Gynecology

## 2024-02-09 ENCOUNTER — Encounter (HOSPITAL_BASED_OUTPATIENT_CLINIC_OR_DEPARTMENT_OTHER): Payer: Self-pay | Admitting: Obstetrics & Gynecology

## 2024-02-09 VITALS — BP 106/61 | HR 87 | Ht 68.0 in | Wt 135.2 lb

## 2024-02-09 DIAGNOSIS — N858 Other specified noninflammatory disorders of uterus: Secondary | ICD-10-CM | POA: Diagnosis not present

## 2024-02-09 DIAGNOSIS — D250 Submucous leiomyoma of uterus: Secondary | ICD-10-CM

## 2024-02-09 DIAGNOSIS — D251 Intramural leiomyoma of uterus: Secondary | ICD-10-CM

## 2024-02-09 DIAGNOSIS — N921 Excessive and frequent menstruation with irregular cycle: Secondary | ICD-10-CM | POA: Diagnosis not present

## 2024-02-09 DIAGNOSIS — N83202 Unspecified ovarian cyst, left side: Secondary | ICD-10-CM | POA: Diagnosis not present

## 2024-02-09 DIAGNOSIS — N926 Irregular menstruation, unspecified: Secondary | ICD-10-CM

## 2024-02-09 DIAGNOSIS — Z1331 Encounter for screening for depression: Secondary | ICD-10-CM | POA: Diagnosis not present

## 2024-02-09 DIAGNOSIS — N9489 Other specified conditions associated with female genital organs and menstrual cycle: Secondary | ICD-10-CM

## 2024-02-09 NOTE — Progress Notes (Signed)
   GYNECOLOGY  VISIT  CC:   surgical discussion   HPI: 53 y.o. G0P0000 Married White or Caucasian female here for discussion of treatment of bleeding.    Flow is irregular and can be 2 - 6 weeks between bleeding.  Flow lasts more than 7 days.  She recently tried to give blood and was declined.  She is tired of having this and would like treatment.  Bleeding is not heavy, just can last longer than 7 days.  Has not had any recent blood work.  Ultrasound done 05/2023 showing uterus measuring 9.7 x 7.0 x 6.1cm with multiple fibroids.  Largest is 2.7cm.  A 3 x 2cm endometrial mass and an additional cervical mass is noted.  These may be connected and then would be 6 x 4cm.  This does have more polypoid appearance but the endometrial mass could be a submucosal fibroid.  Left ovary contained a 3cm simple ovarian cyst.    She has been considering hysterectomy however I do think it is reasonable to just plan to remove the endometrial and cervical masses.  Her bleeding isn't heavy, just prolonged.  Discussed nature of fibroids --non cancerous and will regress with menopause.  Difference between hysteroscopy and hysterectomy, risks and recovery.  Given this, feel it is very reasonable to proceed with minor procedure to see if this will resolve issues.    Will check hemoglobin and iron levels as well.    Last Pap - 05/13/2023 NILM w/HPV negative   Past Medical History:  Diagnosis Date   Allergy    Blood transfusion without reported diagnosis    GERD (gastroesophageal reflux disease)    Internal hemorrhoids     MEDS:  Reviewed in EPIC  ALLERGIES: Penicillins  SH:  married, non smoker  ROS  PHYSICAL EXAMINATION:    BP 106/61 (BP Location: Left Arm, Patient Position: Sitting, Cuff Size: Normal)   Pulse 87   Ht 5' 8 (1.727 m)   Wt 135 lb 3.2 oz (61.3 kg)   LMP 12/29/2023 (Approximate)   SpO2 100%   BMI 20.56 kg/m     Physical Exam Constitutional:      Appearance: Normal appearance.   Neurological:     General: No focal deficit present.     Mental Status: She is alert.  Psychiatric:        Mood and Affect: Mood normal.        Behavior: Behavior normal.    Assessment/Plan: 1. Irregular bleeding (Primary) - pt will return for lab work - CBC; Future - Iron, TIBC and Ferritin Panel; Future  2. Endometrial mass - plan to proceed with hysteroscopy with resection of endometrial and endocervical masses, D&C - Ambulatory Referral For Surgery Scheduling  3. Intramural and submucous leiomyoma of uterus

## 2024-02-14 ENCOUNTER — Other Ambulatory Visit (HOSPITAL_BASED_OUTPATIENT_CLINIC_OR_DEPARTMENT_OTHER): Payer: Self-pay

## 2024-02-14 DIAGNOSIS — N926 Irregular menstruation, unspecified: Secondary | ICD-10-CM

## 2024-02-15 ENCOUNTER — Ambulatory Visit (HOSPITAL_BASED_OUTPATIENT_CLINIC_OR_DEPARTMENT_OTHER): Payer: Self-pay | Admitting: Obstetrics & Gynecology

## 2024-02-15 LAB — CBC
Hematocrit: 45.9 % (ref 34.0–46.6)
Hemoglobin: 15.1 g/dL (ref 11.1–15.9)
MCH: 31.2 pg (ref 26.6–33.0)
MCHC: 32.9 g/dL (ref 31.5–35.7)
MCV: 95 fL (ref 79–97)
Platelets: 343 x10E3/uL (ref 150–450)
RBC: 4.84 x10E6/uL (ref 3.77–5.28)
RDW: 12.1 % (ref 11.7–15.4)
WBC: 5.8 x10E3/uL (ref 3.4–10.8)

## 2024-02-15 LAB — IRON,TIBC AND FERRITIN PANEL
Ferritin: 52 ng/mL (ref 15–150)
Iron Saturation: 12 % — ABNORMAL LOW (ref 15–55)
Iron: 38 ug/dL (ref 27–159)
Total Iron Binding Capacity: 317 ug/dL (ref 250–450)
UIBC: 279 ug/dL (ref 131–425)

## 2024-03-05 ENCOUNTER — Other Ambulatory Visit: Payer: Self-pay | Admitting: Plastic Surgery

## 2024-03-05 ENCOUNTER — Other Ambulatory Visit (HOSPITAL_COMMUNITY): Payer: Self-pay

## 2024-03-05 ENCOUNTER — Other Ambulatory Visit (HOSPITAL_BASED_OUTPATIENT_CLINIC_OR_DEPARTMENT_OTHER): Payer: Self-pay

## 2024-03-05 MED ORDER — LIDOCAINE 23% - TETRACAINE 7% TOPICAL OINTMENT (PLASTICIZED)
1.0000 | TOPICAL_OINTMENT | Freq: Once | CUTANEOUS | 0 refills | Status: AC
  Filled 2024-03-05: qty 60, 1d supply, fill #0

## 2024-03-05 NOTE — Progress Notes (Signed)
Cream for Halo

## 2024-03-06 ENCOUNTER — Other Ambulatory Visit (HOSPITAL_COMMUNITY): Payer: Self-pay

## 2024-03-09 ENCOUNTER — Ambulatory Visit (INDEPENDENT_AMBULATORY_CARE_PROVIDER_SITE_OTHER): Payer: Self-pay

## 2024-03-09 DIAGNOSIS — R238 Other skin changes: Secondary | ICD-10-CM

## 2024-03-09 NOTE — Progress Notes (Signed)
 Preoperative Dx: Facial aging  Postoperative Dx:  same  Procedure: laser to face and neck (Halo full face and neck, and TRL spot treatment)  Anesthesia: none  Description of Procedure:  Risks and complications were explained to the patient. Consent was confirmed and signed. Eye protection was placed. Time out was called and all information was confirmed to be correct. The area  area was prepped with alcohol and wiped dry. The HALO laser was set and the face and neck were lasered (See pictures). Then the deep resurfacing laser was set at 20 J/cm2 and spot treatment was performed to spot on right jaw. The patient tolerated the procedure well and there were no complications. The patient is to follow up in 4 weeks.        Kia Varnadore M. Abbigael Detlefsen, MD [facility]

## 2024-03-13 ENCOUNTER — Encounter (HOSPITAL_BASED_OUTPATIENT_CLINIC_OR_DEPARTMENT_OTHER): Payer: Self-pay

## 2024-03-13 ENCOUNTER — Telehealth: Payer: Self-pay

## 2024-03-13 NOTE — Telephone Encounter (Signed)
 I called patient to see if she's available for surgery with Dr. Cleotilde on 03/27/24 at 1:45 pm at [facility]. Patient agreed to surgery details and pre-op instructions were provided by phone.

## 2024-03-16 ENCOUNTER — Other Ambulatory Visit (HOSPITAL_BASED_OUTPATIENT_CLINIC_OR_DEPARTMENT_OTHER): Payer: Self-pay | Admitting: Obstetrics & Gynecology

## 2024-03-16 DIAGNOSIS — Z01818 Encounter for other preprocedural examination: Secondary | ICD-10-CM

## 2024-03-16 DIAGNOSIS — N926 Irregular menstruation, unspecified: Secondary | ICD-10-CM

## 2024-03-16 DIAGNOSIS — N9489 Other specified conditions associated with female genital organs and menstrual cycle: Secondary | ICD-10-CM

## 2024-03-20 ENCOUNTER — Other Ambulatory Visit (HOSPITAL_BASED_OUTPATIENT_CLINIC_OR_DEPARTMENT_OTHER): Payer: Self-pay | Admitting: Obstetrics & Gynecology

## 2024-03-21 NOTE — Progress Notes (Signed)
 I spoke with patient today for pre-op instructions. She stated that she was working on cancelling her surgery. She had reached out via mychart and would attempt to notify the office as well.

## 2024-03-27 ENCOUNTER — Encounter (HOSPITAL_COMMUNITY): Admission: RE | Payer: Self-pay | Source: Home / Self Care

## 2024-03-27 ENCOUNTER — Ambulatory Visit (HOSPITAL_COMMUNITY): Admission: RE | Admit: 2024-03-27 | Admitting: Obstetrics & Gynecology

## 2024-03-27 SURGERY — DILATATION AND CURETTAGE /HYSTEROSCOPY
Anesthesia: Choice

## 2024-04-10 ENCOUNTER — Ambulatory Visit: Payer: Self-pay

## 2024-04-10 VITALS — BP 97/64 | HR 73

## 2024-04-10 DIAGNOSIS — L988 Other specified disorders of the skin and subcutaneous tissue: Secondary | ICD-10-CM

## 2024-04-10 NOTE — Progress Notes (Signed)

## 2024-04-18 ENCOUNTER — Encounter

## 2024-05-01 IMAGING — MG MM DIGITAL SCREENING BILAT W/ TOMO AND CAD
8 series · 8 of 24 positions shown · non-contrast
Comparison: Previous exam(s).

CLINICAL DATA: Screening.

EXAM:
DIGITAL SCREENING BILATERAL MAMMOGRAM WITH TOMOSYNTHESIS AND CAD
TECHNIQUE: Bilateral screening digital craniocaudal and mediolateral oblique
mammograms were obtained. Bilateral screening digital breast
tomosynthesis was performed. The images were evaluated with
computer-aided detection.

[R MLO synth-2D]
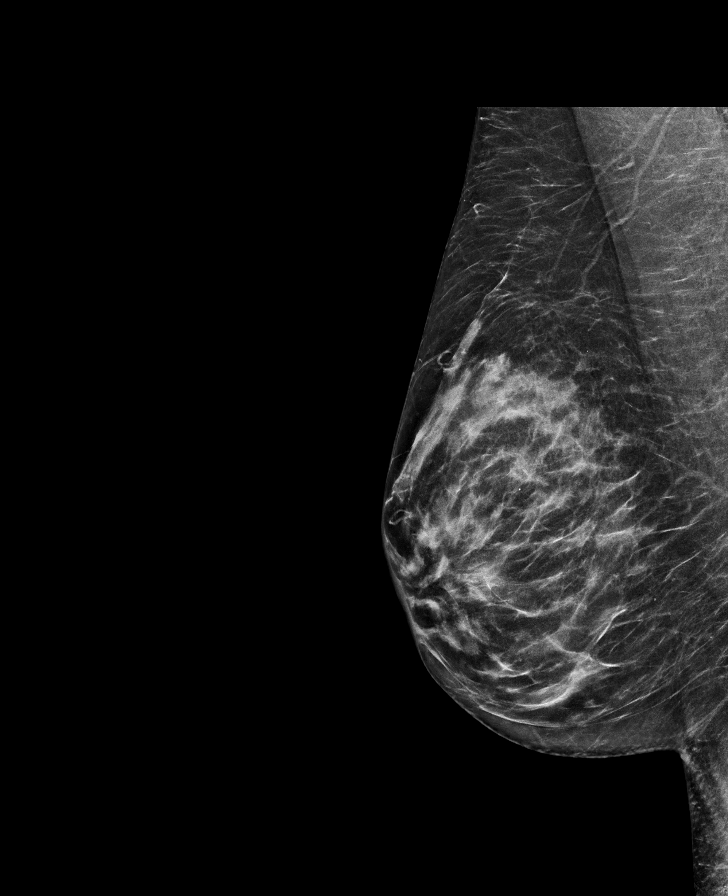

[L MLO synth-2D]
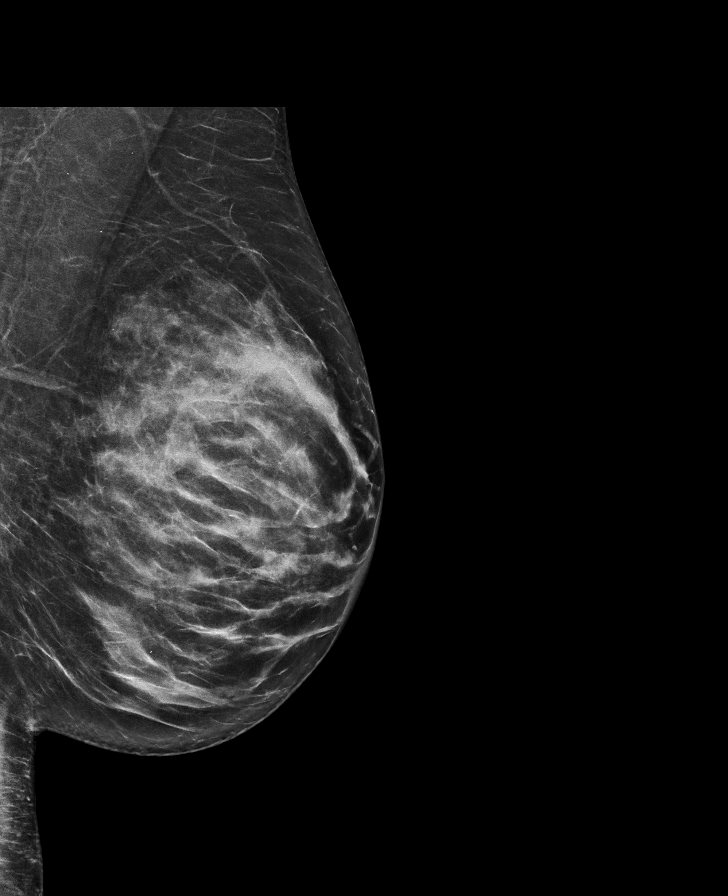

[R CC synth-2D]
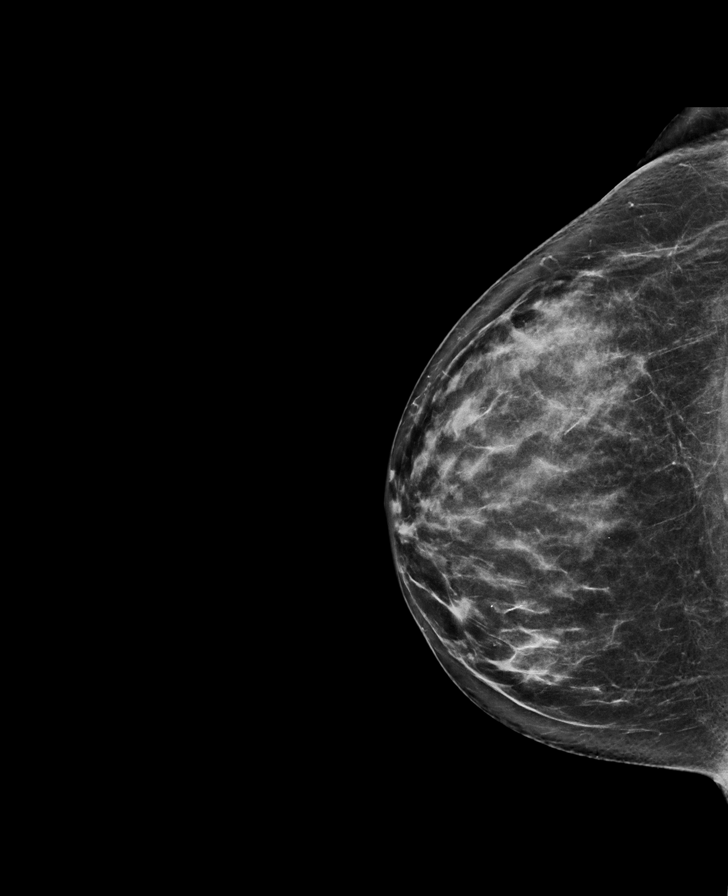

[L CC synth-2D]
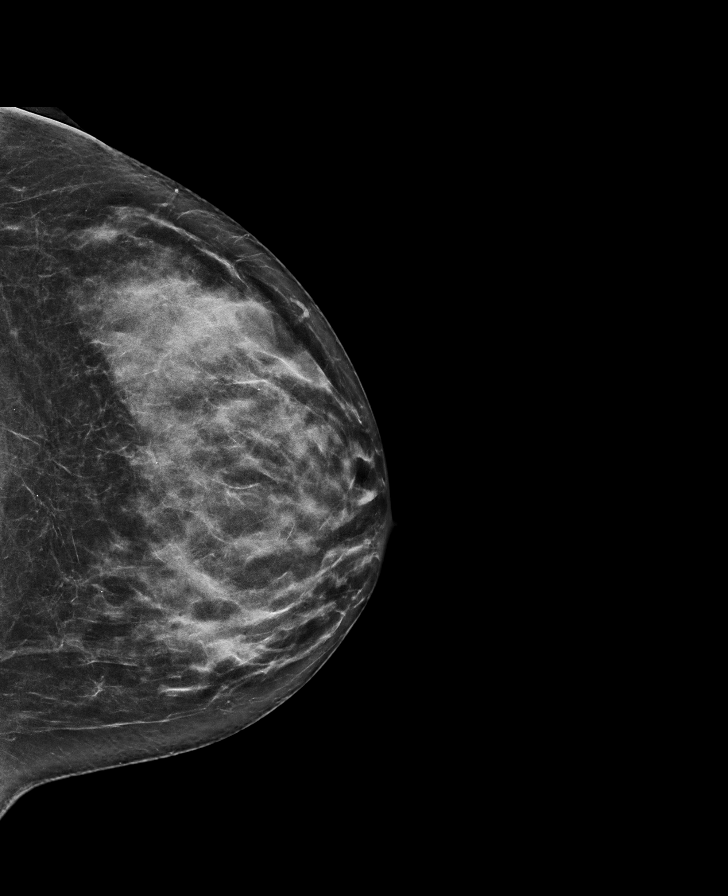

[L CC tomo · tomo slice 36/71.0]
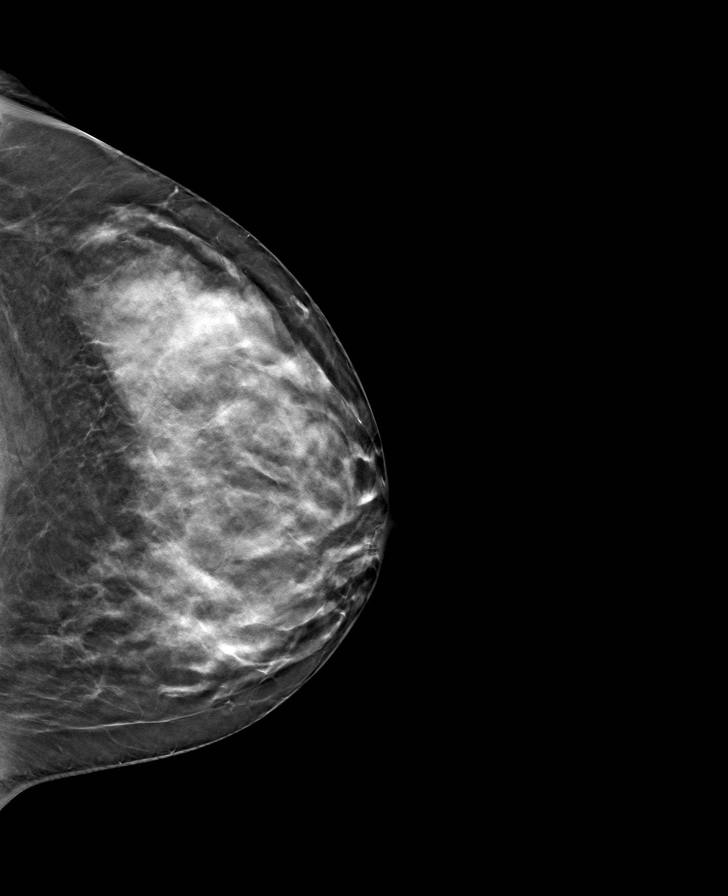

[R CC tomo · tomo slice 35/69.0]
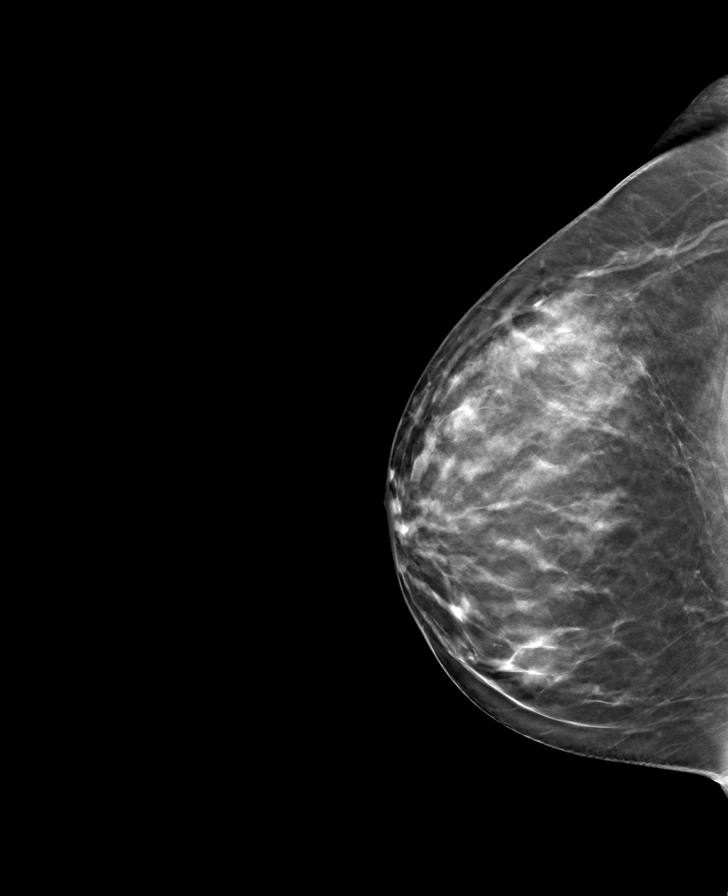

[L MLO tomo · tomo slice 36/71.0]
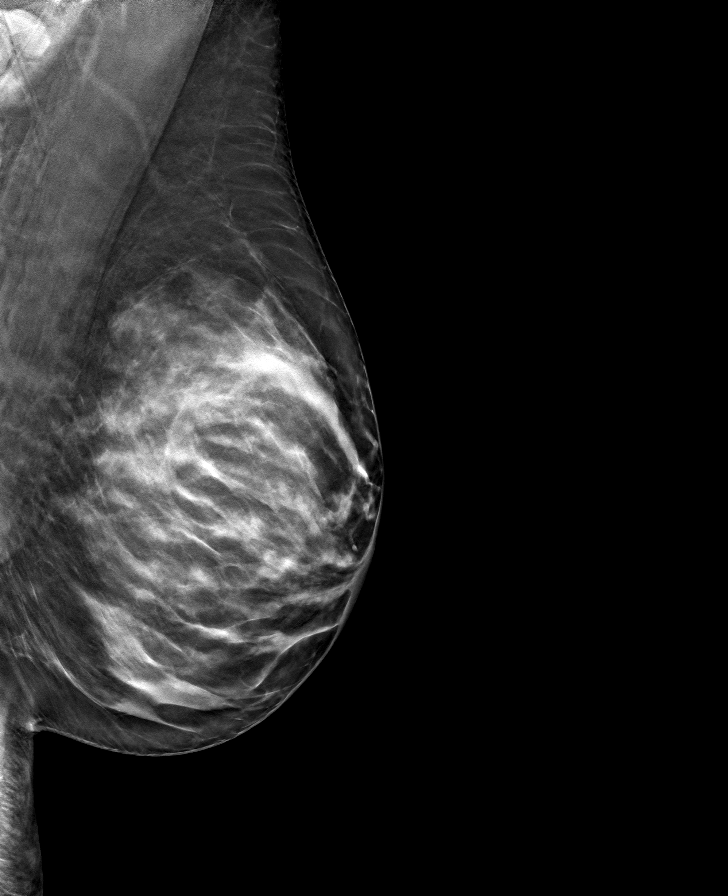

[R MLO tomo · tomo slice 33/65.0]
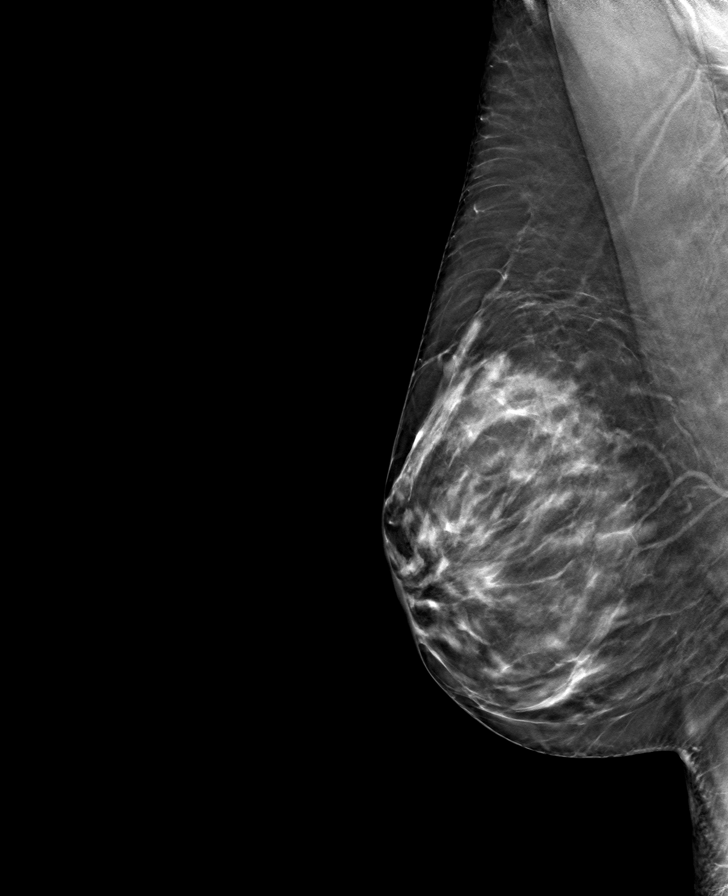

[8 of 24 positions shown; findings below may reference images not displayed]

ACR Breast Density Category c: The breast tissue is heterogeneously
dense, which may obscure small masses.
FINDINGS: There are no findings suspicious for malignancy.
IMPRESSION: No mammographic evidence of malignancy. A result letter of this
screening mammogram will be mailed directly to the patient.

RECOMMENDATION:
Screening mammogram in one year. (Code:Q3-W-BC3)

BI-RADS CATEGORY  1: Negative.

## 2024-05-10 ENCOUNTER — Ambulatory Visit (HOSPITAL_COMMUNITY): Admit: 2024-05-10 | Admitting: Obstetrics & Gynecology
